# Patient Record
Sex: Female | Born: 1980 | Race: White | Hispanic: No | Smoking: Never smoker
Health system: Southern US, Community
[De-identification: ages and names within clinical notes are randomized; demographics above are authoritative.]

## PROBLEM LIST (undated history)

## (undated) ENCOUNTER — Inpatient Hospital Stay (HOSPITAL_COMMUNITY): Payer: Self-pay

## (undated) DIAGNOSIS — E039 Hypothyroidism, unspecified: Secondary | ICD-10-CM

## (undated) DIAGNOSIS — R87619 Unspecified abnormal cytological findings in specimens from cervix uteri: Secondary | ICD-10-CM

## (undated) DIAGNOSIS — R87629 Unspecified abnormal cytological findings in specimens from vagina: Secondary | ICD-10-CM

## (undated) DIAGNOSIS — L03311 Cellulitis of abdominal wall: Secondary | ICD-10-CM

## (undated) DIAGNOSIS — F419 Anxiety disorder, unspecified: Secondary | ICD-10-CM

## (undated) DIAGNOSIS — Z789 Other specified health status: Secondary | ICD-10-CM

## (undated) DIAGNOSIS — O26879 Cervical shortening, unspecified trimester: Secondary | ICD-10-CM

## (undated) HISTORY — DX: Anxiety disorder, unspecified: F41.9

## (undated) HISTORY — DX: Cervical shortening, unspecified trimester: O26.879

## (undated) HISTORY — DX: Cellulitis of abdominal wall: L03.311

## (undated) HISTORY — PX: TUBAL LIGATION: SHX77

## (undated) HISTORY — PX: APPENDECTOMY: SHX54

## (undated) HISTORY — DX: Unspecified abnormal cytological findings in specimens from vagina: R87.629

## (undated) HISTORY — DX: Unspecified abnormal cytological findings in specimens from cervix uteri: R87.619

---

## 2014-06-23 LAB — OB RESULTS CONSOLE HIV ANTIBODY (ROUTINE TESTING): HIV: NONREACTIVE

## 2014-06-23 LAB — OB RESULTS CONSOLE HEPATITIS B SURFACE ANTIGEN: Hepatitis B Surface Ag: NEGATIVE

## 2014-06-23 LAB — OB RESULTS CONSOLE HGB/HCT, BLOOD
HCT: 38 %
Hemoglobin: 12.9 g/dL

## 2014-06-23 LAB — OB RESULTS CONSOLE GC/CHLAMYDIA
Chlamydia: NEGATIVE
Gonorrhea: NEGATIVE

## 2014-06-23 LAB — OB RESULTS CONSOLE PLATELET COUNT: Platelets: 298 10*3/uL

## 2014-06-23 LAB — OB RESULTS CONSOLE VARICELLA ZOSTER ANTIBODY, IGG: Varicella: IMMUNE

## 2014-06-23 LAB — OB RESULTS CONSOLE RUBELLA ANTIBODY, IGM: Rubella: IMMUNE

## 2014-07-22 DIAGNOSIS — IMO0002 Reserved for concepts with insufficient information to code with codable children: Secondary | ICD-10-CM | POA: Insufficient documentation

## 2015-01-28 DIAGNOSIS — F419 Anxiety disorder, unspecified: Secondary | ICD-10-CM | POA: Insufficient documentation

## 2015-01-28 DIAGNOSIS — O09899 Supervision of other high risk pregnancies, unspecified trimester: Secondary | ICD-10-CM | POA: Insufficient documentation

## 2015-01-28 HISTORY — DX: Anxiety disorder, unspecified: F41.9

## 2015-01-28 LAB — OB RESULTS CONSOLE GC/CHLAMYDIA
Chlamydia: NEGATIVE
Gonorrhea: NEGATIVE

## 2015-01-28 LAB — OB RESULTS CONSOLE HIV ANTIBODY (ROUTINE TESTING): HIV: NONREACTIVE

## 2015-01-28 LAB — OB RESULTS CONSOLE RPR: RPR: NONREACTIVE

## 2015-01-28 LAB — OB RESULTS CONSOLE HGB/HCT, BLOOD
HCT: 40 %
Hemoglobin: 13.9 g/dL

## 2015-01-28 LAB — OB RESULTS CONSOLE RUBELLA ANTIBODY, IGM: Rubella: IMMUNE

## 2015-01-28 LAB — OB RESULTS CONSOLE PLATELET COUNT: Platelets: 293 10*3/uL

## 2015-03-25 ENCOUNTER — Encounter: Payer: Self-pay | Admitting: General Practice

## 2015-03-25 ENCOUNTER — Ambulatory Visit: Payer: Medicaid Other | Admitting: General Practice

## 2015-03-25 VITALS — BP 120/68 | HR 72 | Temp 98.6°F | Ht 63.0 in | Wt 139.1 lb

## 2015-03-25 DIAGNOSIS — Z7189 Other specified counseling: Secondary | ICD-10-CM

## 2015-03-25 NOTE — Progress Notes (Signed)
Patient here today to start 17p. Upon reviewing records and dating with patient. Patient is only 15.2 weeks. Discussed with patient that she is too early to start 17p but can start next week. Patient states she has an appt next week at her OB in [facility] and will get first injection there then return here the following week for an injection. Patient had no questions

## 2015-04-09 ENCOUNTER — Ambulatory Visit (INDEPENDENT_AMBULATORY_CARE_PROVIDER_SITE_OTHER): Payer: Medicaid Other | Admitting: General Practice

## 2015-04-09 VITALS — BP 128/70 | HR 81 | Temp 98.6°F | Ht 63.0 in | Wt 143.5 lb

## 2015-04-09 DIAGNOSIS — O09212 Supervision of pregnancy with history of pre-term labor, second trimester: Secondary | ICD-10-CM

## 2015-04-09 MED ORDER — HYDROXYPROGESTERONE CAPROATE 250 MG/ML IM OIL
250.0000 mg | TOPICAL_OIL | Freq: Once | INTRAMUSCULAR | Status: AC
  Administered 2015-04-09: 250 mg via INTRAMUSCULAR

## 2015-04-15 ENCOUNTER — Ambulatory Visit: Payer: Medicaid Other

## 2015-04-16 ENCOUNTER — Ambulatory Visit (INDEPENDENT_AMBULATORY_CARE_PROVIDER_SITE_OTHER): Payer: Medicaid Other | Admitting: *Deleted

## 2015-04-16 DIAGNOSIS — O09212 Supervision of pregnancy with history of pre-term labor, second trimester: Secondary | ICD-10-CM | POA: Diagnosis not present

## 2015-04-16 MED ORDER — HYDROXYPROGESTERONE CAPROATE 250 MG/ML IM OIL
250.0000 mg | TOPICAL_OIL | INTRAMUSCULAR | Status: AC
  Administered 2015-04-16 – 2015-06-18 (×9): 250 mg via INTRAMUSCULAR

## 2015-04-20 ENCOUNTER — Encounter (HOSPITAL_COMMUNITY): Payer: Self-pay | Admitting: *Deleted

## 2015-04-20 ENCOUNTER — Inpatient Hospital Stay (HOSPITAL_COMMUNITY)
Admission: AD | Admit: 2015-04-20 | Discharge: 2015-04-20 | Disposition: A | Discharge status: Home / Self Care | Payer: Medicaid Other | Source: Ambulatory Visit | Attending: Family Medicine | Admitting: Family Medicine

## 2015-04-20 DIAGNOSIS — R109 Unspecified abdominal pain: Secondary | ICD-10-CM | POA: Diagnosis present

## 2015-04-20 DIAGNOSIS — Z3A19 19 weeks gestation of pregnancy: Secondary | ICD-10-CM

## 2015-04-20 DIAGNOSIS — O9989 Other specified diseases and conditions complicating pregnancy, childbirth and the puerperium: Secondary | ICD-10-CM

## 2015-04-20 DIAGNOSIS — O3432 Maternal care for cervical incompetence, second trimester: Secondary | ICD-10-CM | POA: Insufficient documentation

## 2015-04-20 HISTORY — DX: Other specified health status: Z78.9

## 2015-04-20 LAB — URINALYSIS, ROUTINE W REFLEX MICROSCOPIC
Bilirubin Urine: NEGATIVE
Glucose, UA: NEGATIVE mg/dL
Hgb urine dipstick: NEGATIVE
Ketones, ur: NEGATIVE mg/dL
Leukocytes, UA: NEGATIVE
Nitrite: NEGATIVE
Protein, ur: NEGATIVE mg/dL
Specific Gravity, Urine: 1.02 (ref 1.005–1.030)
Urobilinogen, UA: 1 mg/dL (ref 0.0–1.0)
pH: 7 (ref 5.0–8.0)

## 2015-04-20 NOTE — MAU Provider Note (Signed)
  History  [id number] @ 19 wks in with abd cramping that started today. Pt had pretrm at 23 wks that did not survive past 1 month of age. Pt is now 19 wks with rescue stitch in place.  CSN: [id number]  Arrival date and time: 04/20/15 1857   First Provider Initiated Contact with Patient 04/20/15 1918      Chief Complaint  Patient presents with  . Abdominal Pain   HPI  OB History    Gravida Para Term Preterm AB TAB SAB Ectopic Multiple Living   Past Medical History  Diagnosis Date  . Medical history non-contributory     Past Surgical History  Procedure Laterality Date  . Cesarean section    . Appendectomy      No family history on file.  Social History  Substance Use Topics  . Smoking status: Never Smoker   . Smokeless tobacco: None  . Alcohol Use: No    Allergies: Not on File  Facility-administered medications prior to admission  Medication Dose Route Frequency Provider Last Rate Last Dose  . hydroxyprogesterone caproate (DELALUTIN) 250 mg/mL injection 250 mg  250 mg Intramuscular Weekly Adam Phenix, MD   250 mg at 04/16/15 1158   No prescriptions prior to admission    Review of Systems  Constitutional: Negative.   HENT: Negative.   Eyes: Negative.   Respiratory: Negative.   Cardiovascular: Negative.   Gastrointestinal: Positive for abdominal pain.  Genitourinary: Negative.   Musculoskeletal: Negative.   Skin: Negative.   Neurological: Negative.   Endo/Heme/Allergies: Negative.   Psychiatric/Behavioral: Negative.    Physical Exam   Blood pressure 136/77, pulse 72, temperature 98 F (36.7 C), temperature source Oral, resp. rate 18, height  (1.6 m), weight 146 lb 12.8 oz (66.588 kg), last menstrual period 11/26/2014.  Physical Exam  Constitutional: She is oriented to person, place, and time. She appears well-developed and well-nourished.  HENT:  Head: Normocephalic.  Eyes: Pupils are equal, round, and reactive to light.   Neck: Normal range of motion.  Cardiovascular: Normal rate, regular rhythm, normal heart sounds and intact distal pulses.   Respiratory: Effort normal and breath sounds normal.  GI: Soft. Bowel sounds are normal.  Genitourinary: Vagina normal and uterus normal.  Musculoskeletal: Normal range of motion.  Neurological: She is alert and oriented to person, place, and time. She has normal reflexes.  Skin: Skin is warm and dry.  Psychiatric: She has a normal mood and affect. Her behavior is normal. Judgment and thought content normal.    MAU Course  Procedures  MDM Cervical incompetence preg at 19 wks  Assessment and Plan  SVE cl/post/ no tension on stitch/ presenting part high. Will d/c home with no contractions,message sent to high risk clinic to contact pt for appt  Wyvonnia Dusky DARLENE 04/20/2015, 7:32 PM

## 2015-04-20 NOTE — MAU Note (Signed)
Pt presents complaining of lower abdominal pain that started at 3pm. States she had a 23w delivery in April. Had a cerclage placed in [place] at 16w. Denies vaginal bleeding or leaking.

## 2015-04-20 NOTE — Discharge Instructions (Signed)

## 2015-04-22 DIAGNOSIS — Z7185 Encounter for immunization safety counseling: Secondary | ICD-10-CM | POA: Insufficient documentation

## 2015-04-22 DIAGNOSIS — O99891 Other specified diseases and conditions complicating pregnancy: Secondary | ICD-10-CM | POA: Insufficient documentation

## 2015-04-22 DIAGNOSIS — Z2839 Other underimmunization status: Secondary | ICD-10-CM | POA: Insufficient documentation

## 2015-04-22 DIAGNOSIS — O09899 Supervision of other high risk pregnancies, unspecified trimester: Secondary | ICD-10-CM | POA: Insufficient documentation

## 2015-04-23 ENCOUNTER — Ambulatory Visit (INDEPENDENT_AMBULATORY_CARE_PROVIDER_SITE_OTHER): Payer: Medicaid Other

## 2015-04-23 VITALS — BP 112/74 | HR 79 | Wt 146.7 lb

## 2015-04-23 DIAGNOSIS — O09212 Supervision of pregnancy with history of pre-term labor, second trimester: Secondary | ICD-10-CM

## 2015-04-23 DIAGNOSIS — Z8751 Personal history of pre-term labor: Secondary | ICD-10-CM

## 2015-04-30 ENCOUNTER — Ambulatory Visit (INDEPENDENT_AMBULATORY_CARE_PROVIDER_SITE_OTHER): Payer: Medicaid Other | Admitting: *Deleted

## 2015-04-30 DIAGNOSIS — O09212 Supervision of pregnancy with history of pre-term labor, second trimester: Secondary | ICD-10-CM

## 2015-05-07 ENCOUNTER — Ambulatory Visit (INDEPENDENT_AMBULATORY_CARE_PROVIDER_SITE_OTHER): Payer: Medicaid Other

## 2015-05-07 ENCOUNTER — Encounter: Payer: Medicaid Other | Admitting: Obstetrics & Gynecology

## 2015-05-07 VITALS — BP 111/65 | HR 68 | Wt 149.3 lb

## 2015-05-07 DIAGNOSIS — O09212 Supervision of pregnancy with history of pre-term labor, second trimester: Secondary | ICD-10-CM | POA: Diagnosis present

## 2015-05-07 DIAGNOSIS — O09892 Supervision of other high risk pregnancies, second trimester: Secondary | ICD-10-CM

## 2015-05-14 ENCOUNTER — Ambulatory Visit (INDEPENDENT_AMBULATORY_CARE_PROVIDER_SITE_OTHER): Payer: Medicaid Other

## 2015-05-14 VITALS — BP 111/64 | HR 77

## 2015-05-14 DIAGNOSIS — O09892 Supervision of other high risk pregnancies, second trimester: Secondary | ICD-10-CM

## 2015-05-14 DIAGNOSIS — O09212 Supervision of pregnancy with history of pre-term labor, second trimester: Secondary | ICD-10-CM

## 2015-05-21 ENCOUNTER — Ambulatory Visit (INDEPENDENT_AMBULATORY_CARE_PROVIDER_SITE_OTHER): Payer: Medicaid Other | Admitting: *Deleted

## 2015-05-21 DIAGNOSIS — O09212 Supervision of pregnancy with history of pre-term labor, second trimester: Secondary | ICD-10-CM

## 2015-05-21 DIAGNOSIS — Z8751 Personal history of pre-term labor: Secondary | ICD-10-CM

## 2015-05-23 ENCOUNTER — Encounter: Payer: Self-pay | Admitting: *Deleted

## 2015-05-23 DIAGNOSIS — O0990 Supervision of high risk pregnancy, unspecified, unspecified trimester: Secondary | ICD-10-CM

## 2015-05-23 DIAGNOSIS — O09219 Supervision of pregnancy with history of pre-term labor, unspecified trimester: Secondary | ICD-10-CM | POA: Insufficient documentation

## 2015-05-23 DIAGNOSIS — O26879 Cervical shortening, unspecified trimester: Secondary | ICD-10-CM

## 2015-05-23 HISTORY — DX: Cervical shortening, unspecified trimester: O26.879

## 2015-05-28 ENCOUNTER — Ambulatory Visit (INDEPENDENT_AMBULATORY_CARE_PROVIDER_SITE_OTHER): Payer: Medicaid Other

## 2015-05-28 VITALS — BP 122/77 | HR 75 | Wt 153.5 lb

## 2015-05-28 DIAGNOSIS — O09212 Supervision of pregnancy with history of pre-term labor, second trimester: Secondary | ICD-10-CM | POA: Diagnosis not present

## 2015-05-28 NOTE — Progress Notes (Signed)
Contacted [facility] for refill of [employer].  Informed medication should arrive early next week.

## 2015-06-04 ENCOUNTER — Ambulatory Visit (INDEPENDENT_AMBULATORY_CARE_PROVIDER_SITE_OTHER): Payer: Medicaid Other

## 2015-06-04 VITALS — BP 120/68 | HR 91 | Temp 98.4°F | Wt 156.1 lb

## 2015-06-04 DIAGNOSIS — O09212 Supervision of pregnancy with history of pre-term labor, second trimester: Secondary | ICD-10-CM | POA: Diagnosis not present

## 2015-06-04 MED ORDER — HYDROXYPROGESTERONE CAPROATE 250 MG/ML IM OIL
250.0000 mg | TOPICAL_OIL | Freq: Once | INTRAMUSCULAR | Status: AC
  Administered 2015-06-04: 250 mg via INTRAMUSCULAR

## 2015-06-11 ENCOUNTER — Ambulatory Visit (INDEPENDENT_AMBULATORY_CARE_PROVIDER_SITE_OTHER): Payer: Medicaid Other | Admitting: *Deleted

## 2015-06-11 VITALS — BP 119/68 | HR 82 | Temp 98.7°F | Wt 156.0 lb

## 2015-06-11 DIAGNOSIS — O09213 Supervision of pregnancy with history of pre-term labor, third trimester: Secondary | ICD-10-CM

## 2015-06-11 DIAGNOSIS — O09219 Supervision of pregnancy with history of pre-term labor, unspecified trimester: Secondary | ICD-10-CM

## 2015-06-18 ENCOUNTER — Ambulatory Visit (INDEPENDENT_AMBULATORY_CARE_PROVIDER_SITE_OTHER): Payer: Medicaid Other

## 2015-06-18 VITALS — BP 124/68 | HR 97 | Wt 156.5 lb

## 2015-06-18 DIAGNOSIS — O26872 Cervical shortening, second trimester: Secondary | ICD-10-CM | POA: Diagnosis not present

## 2015-06-18 DIAGNOSIS — O26879 Cervical shortening, unspecified trimester: Secondary | ICD-10-CM

## 2015-06-20 ENCOUNTER — Inpatient Hospital Stay (HOSPITAL_COMMUNITY)
Admission: AD | Admit: 2015-06-20 | Discharge: 2015-06-20 | Disposition: A | Discharge status: Home / Self Care | Payer: Medicaid Other | Source: Ambulatory Visit | Attending: Obstetrics and Gynecology | Admitting: Obstetrics and Gynecology

## 2015-06-20 ENCOUNTER — Encounter (HOSPITAL_COMMUNITY): Payer: Self-pay | Admitting: *Deleted

## 2015-06-20 DIAGNOSIS — Z3A27 27 weeks gestation of pregnancy: Secondary | ICD-10-CM | POA: Insufficient documentation

## 2015-06-20 DIAGNOSIS — O4703 False labor before 37 completed weeks of gestation, third trimester: Secondary | ICD-10-CM | POA: Diagnosis not present

## 2015-06-20 DIAGNOSIS — O09212 Supervision of pregnancy with history of pre-term labor, second trimester: Secondary | ICD-10-CM | POA: Insufficient documentation

## 2015-06-20 DIAGNOSIS — O4700 False labor before 37 completed weeks of gestation, unspecified trimester: Secondary | ICD-10-CM | POA: Diagnosis not present

## 2015-06-20 DIAGNOSIS — O0990 Supervision of high risk pregnancy, unspecified, unspecified trimester: Secondary | ICD-10-CM

## 2015-06-20 DIAGNOSIS — O26879 Cervical shortening, unspecified trimester: Secondary | ICD-10-CM

## 2015-06-20 LAB — URINALYSIS, ROUTINE W REFLEX MICROSCOPIC
Bilirubin Urine: NEGATIVE
Glucose, UA: 100 mg/dL — AB
Hgb urine dipstick: NEGATIVE
Ketones, ur: NEGATIVE mg/dL
Leukocytes, UA: NEGATIVE
Nitrite: NEGATIVE
Protein, ur: NEGATIVE mg/dL
Specific Gravity, Urine: <1.005 — ABNORMAL LOW (ref 1.005–1.030)
pH: 5.5 (ref 5.0–8.0)

## 2015-06-20 LAB — WET PREP, GENITAL
Clue Cells Wet Prep HPF POC: NONE SEEN
Sperm: NONE SEEN
Trich, Wet Prep: NONE SEEN
Yeast Wet Prep HPF POC: NONE SEEN

## 2015-06-20 MED ORDER — BETAMETHASONE SOD PHOS & ACET 6 (3-3) MG/ML IJ SUSP
12.0000 mg | Freq: Once | INTRAMUSCULAR | Status: AC
  Administered 2015-06-20: 12 mg via INTRAMUSCULAR
  Filled 2015-06-20: qty 2

## 2015-06-20 MED ORDER — NIFEDIPINE 20 MG PO CAPS: 20.0000 mg | ORAL_CAPSULE | Freq: Three times a day (TID) | ORAL | Status: DC

## 2015-06-20 MED ORDER — NIFEDIPINE 10 MG PO CAPS
20.0000 mg | ORAL_CAPSULE | Freq: Once | ORAL | Status: AC
  Administered 2015-06-20: 20 mg via ORAL
  Filled 2015-06-20: qty 2

## 2015-06-20 MED ORDER — LACTATED RINGERS IV BOLUS (SEPSIS)
1000.0000 mL | Freq: Once | INTRAVENOUS | Status: AC
  Administered 2015-06-20: 1000 mL via INTRAVENOUS

## 2015-06-20 NOTE — MAU Provider Note (Signed)
MAU HISTORY AND PHYSICAL  Chief Complaint:  Contractions   Nicole Pollard is a 34 y.o.  [id number] with IUP at [redacted]w[redacted]d presenting for Contractions  [facility]ed [facility]. Per pt had rescue cerclage placed ~16 weeks for cervical insufficiency. Is taking vaginal prometrium and weekly 17-p.  Reports that beginning last night has experienced mild contractions every 30 minutes or so and leakage of thick mucous, no fluid. NO bleeding. No dysuria or hematuria. Nothing in vagina save for prometrium last 48 hours.    Past Medical History  Diagnosis Date  . Medical history non-contributory   . 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 Abnormal Pap smear of cervix     12/15/14 - ASCUS + HPV    Past Surgical History  Procedure Laterality Date  . Cesarean section    . Appendectomy      Family History  Problem Relation Age of Onset  . Alcohol abuse Mother   . Drug abuse Father   . Hypertension Father   . Anxiety disorder Maternal Aunt   . Cancer Maternal Grandmother     Social History  Substance Use Topics  . Smoking status: Never Smoker   . Smokeless tobacco: Never Used  . Alcohol Use: No    No Known Allergies  Facility-administered medications prior to admission  Medication Dose Route Frequency Provider Last Rate Last Dose  . hydroxyprogesterone caproate (DELALUTIN) 250 mg/mL injection 250 mg  250 mg Intramuscular Weekly Nicole Phenix, MD   250 mg at 06/18/15 4540   Prescriptions prior to admission  Medication Sig Dispense Refill Last Dose  . acetaminophen (TYLENOL) 500 MG tablet Take 500 mg by mouth every 6 (six) hours as needed for mild pain.   Past Week at Unknown time  . Prenatal Vit-Fe Fumarate-FA (PRENATAL MULTIVITAMIN) TABS tablet Take 1 tablet by mouth daily at 12 noon.   06/20/2015 at Unknown time  . progesterone 200 MG SUPP Place 200 mg vaginally at bedtime.   Past Week at Unknown time  . sertraline (ZOLOFT) 50 MG tablet Take 50 mg by mouth daily.    06/20/2015 at Unknown time    Review of Systems - Negative except for what is mentioned in HPI.  Physical Exam  Blood pressure 108/63, pulse 101, temperature 99.2 F (37.3 C), temperature source Oral, resp. rate 18, last menstrual period 11/26/2014. GENERAL: Well-developed, well-nourished female in no acute distress.  LUNGS: Clear to auscultation bilaterally.  HEART: Regular rate and rhythm. ABDOMEN: Soft, nontender, nondistended, gravid.  EXTREMITIES: Nontender, no edema, 2+ distal pulses. GU: no pooling, cerclage in placed, cervix closed on exam without appreciable tension on the cerclage 135/mod/+a/one variable Contractions: irritability   Labs: Results for orders placed or performed during the hospital encounter of 06/20/15 (from the past 24 hour(s))  Wet prep, genital   Collection Time: 06/20/15 12:45 PM  Result Value Ref Range   Yeast Wet Prep HPF POC NONE SEEN NONE SEEN   Trich, Wet Prep NONE SEEN NONE SEEN   Clue Cells Wet Prep HPF POC NONE SEEN NONE SEEN   WBC, Wet Prep HPF POC MODERATE (A) NONE SEEN   Sperm NONE SEEN   Urinalysis, Routine w reflex microscopic (not at [facility])   Collection Time: 06/20/15  1:46 PM  Result Value Ref Range   Color, Urine YELLOW YELLOW   APPearance CLEAR CLEAR   Specific Gravity, Urine <1.005 (L) 1.005 - 1.030   pH 5.5 5.0 - 8.0  Glucose, UA 100 (A) NEGATIVE mg/dL   Hgb urine dipstick NEGATIVE NEGATIVE   Bilirubin Urine NEGATIVE NEGATIVE   Ketones, ur NEGATIVE NEGATIVE mg/dL   Protein, ur NEGATIVE NEGATIVE mg/dL   Nitrite NEGATIVE NEGATIVE   Leukocytes, UA NEGATIVE NEGATIVE    Imaging Studies:  No results found.  Assessment: Nicole Pollard is  34 y.o. [id number] at 5067w5d presents with threatened preterm labor. Rescue cerclage in place. Contractions are mild and infrequent, and on exam cervix closed without appreciable tension on the cerclage. No pooling and fern negative so do not think PPROM. Symptoms improved with IV hydration and  procardia. No signs vaginitis on wet prep; urinalysis not suggestive of infection, and gc/chlamydia ordered and also pending. Gave first dose betamethasone at 14:00; has not had corticosteroids yet this pregnancy.  Plan: - PTL/PPROM/abruption return precautions - return to our MAU tomorrow at 14:00 for second dose betamethasone - f/u gc/chlamydia - procardia prn - hydration - [facility] f/u this coming week as scheduled  Nicole Pollard 11/19/20162:49 PM

## 2015-06-20 NOTE — MAU Note (Signed)
Pt started feeling contractions last night and a stabbing feeling in her vagina.  Lost her mucus plug when she went to the bathroom this morning. Has a cerglage since 16 weeks.  Previous preterm delivery with first baby (33weeks) and last baby (23 weeks).

## 2015-06-23 LAB — CULTURE, BETA STREP (GROUP B ONLY)

## 2015-06-24 ENCOUNTER — Ambulatory Visit: Payer: Medicaid Other

## 2015-06-24 LAB — GC/CHLAMYDIA PROBE AMP (~~LOC~~) NOT AT ARMC
Chlamydia: NEGATIVE
Neisseria Gonorrhea: NEGATIVE

## 2015-07-02 ENCOUNTER — Ambulatory Visit: Payer: Medicaid Other

## 2015-07-09 ENCOUNTER — Ambulatory Visit: Payer: Medicaid Other

## 2015-07-16 ENCOUNTER — Telehealth: Payer: Self-pay | Admitting: Family Medicine

## 2015-07-16 ENCOUNTER — Encounter: Payer: Self-pay | Admitting: Obstetrics & Gynecology

## 2015-07-16 ENCOUNTER — Ambulatory Visit: Payer: Medicaid Other

## 2015-07-16 NOTE — Telephone Encounter (Signed)
Called patient, no voicemail setup. Will mail dnka letter regarding missed appointment.

## 2016-01-28 ENCOUNTER — Encounter (HOSPITAL_COMMUNITY): Payer: Self-pay | Admitting: *Deleted

## 2016-04-21 ENCOUNTER — Emergency Department
Admission: EM | Admit: 2016-04-21 | Discharge: 2016-04-21 | Disposition: A | Discharge status: Home / Self Care | Payer: Medicaid Other | Attending: Emergency Medicine | Admitting: Emergency Medicine

## 2016-04-21 DIAGNOSIS — M545 Low back pain, unspecified: Secondary | ICD-10-CM

## 2016-04-21 DIAGNOSIS — Z79899 Other long term (current) drug therapy: Secondary | ICD-10-CM | POA: Insufficient documentation

## 2016-04-21 LAB — BASIC METABOLIC PANEL
Anion gap: 7 (ref 5–15)
BUN: 14 mg/dL (ref 6–20)
CO2: 27 mmol/L (ref 22–32)
Calcium: 9.2 mg/dL (ref 8.9–10.3)
Chloride: 105 mmol/L (ref 101–111)
Creatinine, Ser: 0.62 mg/dL (ref 0.44–1.00)
GFR calc Af Amer: >60 mL/min (ref >60)
GFR calc non Af Amer: >60 mL/min (ref >60)
Glucose, Bld: 97 mg/dL (ref 65–99)
Potassium: 3.4 mmol/L — ABNORMAL LOW (ref 3.5–5.1)
Sodium: 139 mmol/L (ref 135–145)

## 2016-04-21 LAB — URINALYSIS COMPLETE WITH MICROSCOPIC (ARMC ONLY)
Bacteria, UA: NONE SEEN
Bilirubin Urine: NEGATIVE
Glucose, UA: NEGATIVE mg/dL
Hgb urine dipstick: NEGATIVE
Ketones, ur: NEGATIVE mg/dL
Leukocytes, UA: NEGATIVE
Nitrite: NEGATIVE
Protein, ur: NEGATIVE mg/dL
Specific Gravity, Urine: 1.006 (ref 1.005–1.030)
Squamous Epithelial / LPF: NONE SEEN
pH: 6 (ref 5.0–8.0)

## 2016-04-21 LAB — CBC
HCT: 39.9 % (ref 35.0–47.0)
Hemoglobin: 13.4 g/dL (ref 12.0–16.0)
MCH: 30.2 pg (ref 26.0–34.0)
MCHC: 33.6 g/dL (ref 32.0–36.0)
MCV: 90 fL (ref 80.0–100.0)
Platelets: 335 10*3/uL (ref 150–440)
RBC: 4.44 MIL/uL (ref 3.80–5.20)
RDW: 12.7 % (ref 11.5–14.5)
WBC: 8.4 10*3/uL (ref 3.6–11.0)

## 2016-04-21 LAB — POCT PREGNANCY, URINE: Preg Test, Ur: NEGATIVE

## 2016-04-21 NOTE — ED Provider Notes (Signed)
[facility] Emergency Department Provider Note  ____________________________________________   I have reviewed the triage vital signs and the nursing notes.   HISTORY  Chief Complaint Flank Pain    HPI Nicole Pollard is a 35 y.o. female who presents today complaining of occasional lower back pain. Patient states she had a urinary tract infection about one month ago took 3 days of antibiotics after her positive culture and was then asymptomatic. She had no dysuria or urinary frequency. However, since that time she has occasional back pain. Patient states this is happened to her before. She has had negative renal ultrasounds in the past. She does have a history recurrent urinary tract infections. She is not having any urinary symptoms at this time. She denies any fever or chills. She is not having any pain at this moment. Sometimes the pain is when she twists or moves the wrong way. No numbness no weakness no incontinence of bowel or bladder. Denies any other complaint.      Past Medical History:  Diagnosis Date  . Abnormal Pap smear of cervix    12/15/14 - ASCUS + HPV  . Medical history non-contributory   . Vaginal Pap smear, abnormal    06/23/14 ASCUS + HPV    Patient Active Problem List   Diagnosis Date Noted  . Supervision of high-risk pregnancy 05/23/2015  . Current pregnancy with history of preterm labor 05/23/2015  . Short cervix affecting pregnancy 05/23/2015    Past Surgical History:  Procedure Laterality Date  . APPENDECTOMY    . CESAREAN SECTION      Prior to Admission medications   Medication Sig Start Date End Date Taking? Authorizing Provider  acetaminophen (TYLENOL) 500 MG tablet Take 500 mg by mouth every 6 (six) hours as needed for mild pain.    Historical Provider, MD  NIFEdipine (PROCARDIA) 20 MG capsule Take 1 capsule (20 mg total) by mouth 3 (three) times daily. 06/20/15   Kathrynn RunningNoah Bedford Wouk, MD  Prenatal Vit-Fe Fumarate-FA  (PRENATAL MULTIVITAMIN) TABS tablet Take 1 tablet by mouth daily at 12 noon.    Historical Provider, MD  progesterone 200 MG SUPP Place 200 mg vaginally at bedtime.    Historical Provider, MD  sertraline (ZOLOFT) 50 MG tablet Take 50 mg by mouth daily.    Historical Provider, MD    Allergies Review of patient's allergies indicates no known allergies.  Family History  Problem Relation Age of Onset  . Alcohol abuse Mother   . Drug abuse Father   . Hypertension Father   . Anxiety disorder Maternal Aunt   . Cancer Maternal Grandmother     Social History Social History  Substance Use Topics  . Smoking status: Never Smoker  . Smokeless tobacco: Never Used  . Alcohol use No    Review of Systems Constitutional: No fever/chills Eyes: No visual changes. ENT: No sore throat. No stiff neck no neck pain Cardiovascular: Denies chest pain. Respiratory: Denies shortness of breath. Gastrointestinal:   no vomiting.  No diarrhea.  No constipation. Genitourinary: Negative for dysuria. Musculoskeletal: Negative lower extremity swelling Skin: Negative for rash. Neurological: Negative for severe headaches, focal weakness or numbness. 10-point ROS otherwise negative.  ____________________________________________   PHYSICAL EXAM:  VITAL SIGNS: ED Triage Vitals [04/21/16 1916]  Enc Vitals Group     BP 121/78     Pulse Rate 89     Resp 16     Temp 97.8 F (36.6 C)     Temp Source Oral  SpO2 100 %     Weight 157 lb (71.2 kg)     Height 5\' 3"  (1.6 m)     Head Circumference      Peak Flow      Pain Score 7     Pain Loc      Pain Edu?      Excl. in GC?     Constitutional: Alert and oriented. Well appearing and in no acute distress. Eyes: Conjunctivae are normal. PERRL. EOMI. Head: Atraumatic. Nose: No congestion/rhinnorhea. Mouth/Throat: Mucous membranes are moist.  Oropharynx non-erythematous. Neck: No stridor.   Nontender with no meningismus Cardiovascular: Normal rate,  regular rhythm. Grossly normal heart sounds.  Good peripheral circulation. Respiratory: Normal respiratory effort.  No retractions. Lungs CTAB. Abdominal: Soft and nontender. No distention. No guarding no rebound Back:  There is no focal tenderness or step off.  there is no midline tenderness there are no lesions noted. there is no CVA tenderness Musculoskeletal: No lower extremity tenderness, no upper extremity tenderness. No joint effusions, no DVT signs strong distal pulses no edema Neurologic:  Normal speech and language. No gross focal neurologic deficits are appreciated.  Skin:  Skin is warm, dry and intact. No rash noted. Psychiatric: Mood and affect are normal. Speech and behavior are normal.  ____________________________________________   LABS (all labs ordered are listed, but only abnormal results are displayed)  Labs Reviewed  URINALYSIS COMPLETEWITH MICROSCOPIC ([facility] ONLY) - Abnormal; Notable for the following:       Result Value   Color, Urine STRAW (*)    APPearance CLEAR (*)    All other components within normal limits  BASIC METABOLIC PANEL - Abnormal; Notable for the following:    Potassium 3.4 (*)    All other components within normal limits  URINE CULTURE  CBC  POC URINE PREG, ED  POCT PREGNANCY, URINE   ____________________________________________  EKG  I personally interpreted any EKGs ordered by me or triage  ____________________________________________  RADIOLOGY  I reviewed any imaging ordered by me or triage that were performed during my shift and, if possible, patient and/or family made aware of any abnormal findings. ____________________________________________   PROCEDURES  Procedure(s) performed: None  Procedures  Critical Care performed: None  ____________________________________________   INITIAL IMPRESSION / ASSESSMENT AND PLAN / ED COURSE  Pertinent labs & imaging results that were available during my care of the patient were  reviewed by me and considered in my medical decision making (see chart for details).  Patient very well-appearing with no evidence of reproducible pain no evidence of urinary tract infection, blood work is reassuring, exam is reassuring, no evidence of cauda equina syndrome. She is not pregnant. I did offer to keep her here for ultrasound of her kidney center that there is no Hydro after multiple pregnancies, but she declines. She states she has had that done and has appointment with her OB in the next few days. She's not having any pain at this moment she does not wish any pain medication, I have sent a urine culture and if positive we'll treat her that case. Patient very comfortable with this plan and will return if she feels worse.  Clinical Course   ____________________________________________   FINAL CLINICAL IMPRESSION(S) / ED DIAGNOSES  Final diagnoses:  None      This chart was dictated using voice recognition software.  Despite best efforts to proofread,  errors can occur which can change meaning.      Jeanmarie Plant, MD 04/21/16  2157  

## 2016-04-21 NOTE — ED Triage Notes (Signed)
Pt presents to ED with c/o bilateral flank pain for greater than 1 month. Pt reports going to the clinic about 3 weeks ago and r/x'd Macrobid and took it without relief. Pt denies any N/V/D or fever, reports denies any pain or burning with urination. Pt reports pain increases when "I turn a certain way". Pt reports previous h/x of kidney infections "several times".

## 2016-04-23 LAB — URINE CULTURE

## 2016-06-28 HISTORY — PX: ABDOMINOPLASTY: SUR9

## 2016-07-17 DIAGNOSIS — Z79899 Other long term (current) drug therapy: Secondary | ICD-10-CM | POA: Insufficient documentation

## 2016-07-17 DIAGNOSIS — L231 Allergic contact dermatitis due to adhesives: Secondary | ICD-10-CM | POA: Insufficient documentation

## 2016-07-17 LAB — CBC
HCT: 37.1 % (ref 35.0–47.0)
Hemoglobin: 12.6 g/dL (ref 12.0–16.0)
MCH: 29.3 pg (ref 26.0–34.0)
MCHC: 33.9 g/dL (ref 32.0–36.0)
MCV: 86.4 fL (ref 80.0–100.0)
Platelets: 411 10*3/uL (ref 150–440)
RBC: 4.3 MIL/uL (ref 3.80–5.20)
RDW: 14.4 % (ref 11.5–14.5)
WBC: 7.4 10*3/uL (ref 3.6–11.0)

## 2016-07-17 LAB — BASIC METABOLIC PANEL
Anion gap: 6 (ref 5–15)
BUN: 10 mg/dL (ref 6–20)
CO2: 26 mmol/L (ref 22–32)
Calcium: 9 mg/dL (ref 8.9–10.3)
Chloride: 105 mmol/L (ref 101–111)
Creatinine, Ser: 0.72 mg/dL (ref 0.44–1.00)
GFR calc Af Amer: >60 mL/min (ref >60)
GFR calc non Af Amer: >60 mL/min (ref >60)
Glucose, Bld: 101 mg/dL — ABNORMAL HIGH (ref 65–99)
Potassium: 3.7 mmol/L (ref 3.5–5.1)
Sodium: 137 mmol/L (ref 135–145)

## 2016-07-17 LAB — LACTIC ACID, PLASMA: Lactic Acid, Venous: 1.3 mmol/L (ref 0.5–1.9)

## 2016-07-17 NOTE — ED Triage Notes (Signed)
Pt [facility] [place] by Dr. Garnette CzechSampson.  Pt states that on 12/9 she noticed that the incision had some drainage come from it and was more red.  She was prescribed cephalexin by Dr in [place] and had finished those antibiotics.  Pt then went to a clinic in [place] and was given Bactrim.  Pt states she has one more dose of bactrim, with no relief.  Pt was told by friend to crush an antibiotic pill and put it in the incision area to help with infection.  Area cleaned off and some dried purulent drainage noted to middle of incision area.  Entire incision area inflamed, red and warm to touch.  Pt denies fevers at home.  Ambulatory to triage with NAD noted at this time.

## 2016-07-18 ENCOUNTER — Emergency Department
Admission: EM | Admit: 2016-07-18 | Discharge: 2016-07-18 | Disposition: A | Discharge status: Home / Self Care | Payer: Medicaid Other | Attending: Emergency Medicine | Admitting: Emergency Medicine

## 2016-07-18 DIAGNOSIS — L231 Allergic contact dermatitis due to adhesives: Secondary | ICD-10-CM

## 2016-07-18 NOTE — ED Provider Notes (Signed)
[facility] Emergency Department Provider Note  ____________________________________________   First MD Initiated Contact with Patient 07/18/16 [phone]     (approximate)  I have reviewed the triage vital signs and the nursing notes.   HISTORY  Chief Complaint Post-op Problem   HPI Jerl MinaBrenna Helm is a 35 y.o. female who had a [place]os[place] in [place] in late November of this year who is presenting with irritation over the incision site. She says that she has been placed on Keflex and then Bactrim and is almost finished her course of Bactrim. She says that after using adhesive tape that she bought [facility] to cover the scar she began having redness with itching. She says there is a small area in the middle of the scar where she is seen a small amount of pus although it is dry at this time. Denies any fever. Denies any worsening of pain over the incision or to her abdomen. Denies any nausea vomiting or diarrhea.   Past Medical History:  Diagnosis Date  . Abnormal Pap smear of cervix    12/15/14 - ASCUS + HPV  . Medical history non-contributory   . Vaginal Pap smear, abnormal    06/23/14 ASCUS + HPV    Patient Active Problem List   Diagnosis Date Noted  . Supervision of high-risk pregnancy 05/23/2015  . Current pregnancy with history of preterm labor 05/23/2015  . Short cervix affecting pregnancy 05/23/2015    Past Surgical History:  Procedure Laterality Date  . ABDOMINOPLASTY  06/28/2016  . APPENDECTOMY    . CESAREAN SECTION      Prior to Admission medications   Medication Sig Start Date End Date Taking? Authorizing Provider  acetaminophen (TYLENOL) 500 MG tablet Take 500 mg by mouth every 6 (six) hours as needed for mild pain.    Historical Provider, MD  NIFEdipine (PROCARDIA) 20 MG capsule Take 1 capsule (20 mg total) by mouth 3 (three) times daily. 06/20/15   Kathrynn RunningNoah Bedford Wouk, MD  Prenatal Vit-Fe Fumarate-FA (PRENATAL MULTIVITAMIN) TABS  tablet Take 1 tablet by mouth daily at 12 noon.    Historical Provider, MD  progesterone 200 MG SUPP Place 200 mg vaginally at bedtime.    Historical Provider, MD  sertraline (ZOLOFT) 50 MG tablet Take 50 mg by mouth daily.    Historical Provider, MD    Allergies Patient has no known allergies.  Family History  Problem Relation Age of Onset  . Alcohol abuse Mother   . Drug abuse Father   . Hypertension Father   . Anxiety disorder Maternal Aunt   . Cancer Maternal Grandmother     Social History Social History  Substance Use Topics  . Smoking status: Never Smoker  . Smokeless tobacco: Never Used  . Alcohol use No    Review of Systems Constitutional: No fever/chills Eyes: No visual changes. ENT: No sore throat. Cardiovascular: Denies chest pain. Respiratory: Denies shortness of breath. Gastrointestinal:  No nausea, no vomiting.  No diarrhea.  No constipation. Genitourinary: Negative for dysuria. Musculoskeletal: Negative for back pain. Skin: As above Neurological: Negative for headaches, focal weakness or numbness.  10-point ROS otherwise negative.  ____________________________________________   PHYSICAL EXAM:  VITAL SIGNS: ED Triage Vitals  Enc Vitals Group     BP 07/17/16 2103 123/75     Pulse Rate 07/17/16 2103 90     Resp 07/17/16 2103 18     Temp 07/17/16 2103 98.4 F (36.9 C)     Temp Source 07/17/16  2103 Oral     SpO2 07/17/16 2103 100 %     Weight 07/17/16 2103 150 lb (68 kg)     Height 07/17/16 2103 5\' 3"  (1.6 m)     Head Circumference --      Peak Flow --      Pain Score 07/17/16 2104 4     Pain Loc --      Pain Edu? --      Excl. in GC? --     Constitutional: Alert and oriented. Well appearing and in no acute distress. Eyes: Conjunctivae are normal. PERRL. EOMI. Head: Atraumatic. Nose: No congestion/rhinnorhea. Mouth/Throat: Mucous membranes are moist. Neck: No stridor.   Cardiovascular: Normal rate, regular rhythm. Grossly normal heart  sounds.   Respiratory: Normal respiratory effort.  No retractions. Lungs CTAB. Gastrointestinal: Soft and nontender. No distention. No CVA tenderness. Musculoskeletal: No lower extremity tenderness nor edema.  No joint effusions. Neurologic:  Normal speech and language. No gross focal neurologic deficits are appreciated. No gait instability. Skin:  Skin is warm, dry and intact. Mild erythema in a rectangular distribution directly overlying the patient's liposuction scar which extends the length of the lower abdomen. There is no dehiscence. No pus. No fluctuance. Also a small area of erythema over the umbilicus where she says she also had placed tape. There is no induration.  Psychiatric: Mood and affect are normal. Speech and behavior are normal.  ____________________________________________   LABS (all labs ordered are listed, but only abnormal results are displayed)  Labs Reviewed  BASIC METABOLIC PANEL - Abnormal; Notable for the following:       Result Value   Glucose, Bld 101 (*)    All other components within normal limits  CBC  LACTIC ACID, PLASMA   ____________________________________________  EKG   ____________________________________________  RADIOLOGY   ____________________________________________   PROCEDURES  Procedure(s) performed:   Procedures  Critical Care performed:   ____________________________________________   INITIAL IMPRESSION / ASSESSMENT AND PLAN / ED COURSE  Pertinent labs & imaging results that were available during my care of the patient were reviewed by me and considered in my medical decision making (see chart for details).   Clinical Course   ----------------------------------------- 1:06 AM on 07/18/2016 -----------------------------------------  Likely contact dermatitis from the tape the patient use. Will be discharged to home. Discussed finishing antibiotics and also using Neosporin cream and Benadryl cream as needed. She is  understanding of the plan and willing to comply.   ____________________________________________   FINAL CLINICAL IMPRESSION(S) / ED DIAGNOSES  Contact dermatitis.    NEW MEDICATIONS STARTED DURING THIS VISIT:  New Prescriptions   No medications on file     Note:  This document was prepared using Dragon voice recognition software and may include unintentional dictation errors.    Myrna Blazeravid Matthew Schaevitz, MD 07/18/16 [phone]

## 2016-07-18 NOTE — ED Notes (Signed)
Pt ambulatory with steady gait to registration area; waiting patiently for treatment room; pt says abd pain is worse if she presses on it; at rest rates 4/10; understands waiting for treatment room and should not eat or drink anything until seen by provider

## 2016-07-18 NOTE — ED Notes (Signed)
Pt had a "tummy tuck," done in [place] on 06/28/2016. Pt went to a clinic on 12/11 and received further antibiotics for an incisional infection. Pt's incision is red and pt has had some drainage from the incision site in the middle, that is yellow. Pt denying any fevers. Pt stating that she has some abdominal pain near her umbilicus. Pt stating that she also did have liposuction done. Pt is just concerned that her infection might not be cleared up, and she is almost out of her antibiotics.

## 2016-08-31 ENCOUNTER — Emergency Department (HOSPITAL_COMMUNITY)
Admission: EM | Admit: 2016-08-31 | Discharge: 2016-09-01 | Disposition: A | Discharge status: Home / Self Care | Payer: Self-pay | Attending: Emergency Medicine | Admitting: Emergency Medicine

## 2016-08-31 ENCOUNTER — Encounter (HOSPITAL_COMMUNITY): Payer: Self-pay

## 2016-08-31 DIAGNOSIS — M6208 Separation of muscle (nontraumatic), other site: Secondary | ICD-10-CM

## 2016-08-31 DIAGNOSIS — Z9889 Other specified postprocedural states: Secondary | ICD-10-CM

## 2016-08-31 DIAGNOSIS — T8149XA Infection following a procedure, other surgical site, initial encounter: Secondary | ICD-10-CM

## 2016-08-31 DIAGNOSIS — L03311 Cellulitis of abdominal wall: Secondary | ICD-10-CM | POA: Insufficient documentation

## 2016-08-31 DIAGNOSIS — T8141XA Infection following a procedure, superficial incisional surgical site, initial encounter: Secondary | ICD-10-CM

## 2016-08-31 NOTE — ED Triage Notes (Signed)
Patient had tummy tuck in November has been receiving  Antibiotic for infection at surgical incision since then. Small amount of drainage present on assessment. Pt state with pressure it oozes.

## 2016-09-01 ENCOUNTER — Emergency Department (HOSPITAL_COMMUNITY): Payer: Self-pay

## 2016-09-01 ENCOUNTER — Encounter (HOSPITAL_COMMUNITY): Payer: Self-pay

## 2016-09-01 DIAGNOSIS — L03311 Cellulitis of abdominal wall: Secondary | ICD-10-CM

## 2016-09-01 DIAGNOSIS — M6208 Separation of muscle (nontraumatic), other site: Secondary | ICD-10-CM

## 2016-09-01 DIAGNOSIS — Z9889 Other specified postprocedural states: Secondary | ICD-10-CM

## 2016-09-01 DIAGNOSIS — T8149XA Infection following a procedure, other surgical site, initial encounter: Secondary | ICD-10-CM

## 2016-09-01 DIAGNOSIS — T8141XA Infection following a procedure, superficial incisional surgical site, initial encounter: Secondary | ICD-10-CM

## 2016-09-01 HISTORY — DX: Cellulitis of abdominal wall: L03.311

## 2016-09-01 LAB — URINALYSIS, ROUTINE W REFLEX MICROSCOPIC
Bilirubin Urine: NEGATIVE
Glucose, UA: NEGATIVE mg/dL
Ketones, ur: NEGATIVE mg/dL
Leukocytes, UA: NEGATIVE
Nitrite: NEGATIVE
Protein, ur: NEGATIVE mg/dL
Specific Gravity, Urine: 1.013 (ref 1.005–1.030)
pH: 6 (ref 5.0–8.0)

## 2016-09-01 LAB — I-STAT BETA HCG BLOOD, ED (MC, WL, AP ONLY): I-stat hCG, quantitative: <5 m[IU]/mL (ref <5)

## 2016-09-01 LAB — CBC WITH DIFFERENTIAL/PLATELET
Basophils Absolute: 0.1 10*3/uL (ref 0.0–0.1)
Basophils Relative: 1 %
Eosinophils Absolute: 0.1 10*3/uL (ref 0.0–0.7)
Eosinophils Relative: 1 %
HCT: 35.3 % — ABNORMAL LOW (ref 36.0–46.0)
Hemoglobin: 11.5 g/dL — ABNORMAL LOW (ref 12.0–15.0)
Lymphocytes Relative: 24 %
Lymphs Abs: 2.6 10*3/uL (ref 0.7–4.0)
MCH: 27.8 pg (ref 26.0–34.0)
MCHC: 32.6 g/dL (ref 30.0–36.0)
MCV: 85.5 fL (ref 78.0–100.0)
Monocytes Absolute: 1.4 10*3/uL — ABNORMAL HIGH (ref 0.1–1.0)
Monocytes Relative: 13 %
Neutro Abs: 6.8 10*3/uL (ref 1.7–7.7)
Neutrophils Relative %: 61 %
Platelets: 394 10*3/uL (ref 150–400)
RBC: 4.13 MIL/uL (ref 3.87–5.11)
RDW: 13.9 % (ref 11.5–15.5)
WBC: 11 10*3/uL — ABNORMAL HIGH (ref 4.0–10.5)

## 2016-09-01 LAB — COMPREHENSIVE METABOLIC PANEL
ALT: 24 U/L (ref 14–54)
AST: 21 U/L (ref 15–41)
Albumin: 4.7 g/dL (ref 3.5–5.0)
Alkaline Phosphatase: 73 U/L (ref 38–126)
Anion gap: 11 (ref 5–15)
BUN: 9 mg/dL (ref 6–20)
CO2: 24 mmol/L (ref 22–32)
Calcium: 9.2 mg/dL (ref 8.9–10.3)
Chloride: 103 mmol/L (ref 101–111)
Creatinine, Ser: 0.48 mg/dL (ref 0.44–1.00)
GFR calc Af Amer: >60 mL/min (ref >60)
GFR calc non Af Amer: >60 mL/min (ref >60)
Glucose, Bld: 96 mg/dL (ref 65–99)
Potassium: 3.2 mmol/L — ABNORMAL LOW (ref 3.5–5.1)
Sodium: 138 mmol/L (ref 135–145)
Total Bilirubin: 0.2 mg/dL — ABNORMAL LOW (ref 0.3–1.2)
Total Protein: 8.1 g/dL (ref 6.5–8.1)

## 2016-09-01 MED ORDER — IOPAMIDOL (ISOVUE-300) INJECTION 61%
100.0000 mL | Freq: Once | INTRAVENOUS | Status: AC | PRN
  Administered 2016-09-01: 100 mL via INTRAVENOUS

## 2016-09-01 MED ORDER — HYDROCODONE-ACETAMINOPHEN 5-325 MG PO TABS: 1.0000 | ORAL_TABLET | Freq: Four times a day (QID) | ORAL | 0 refills | Status: DC | PRN

## 2016-09-01 MED ORDER — IOPAMIDOL (ISOVUE-300) INJECTION 61%
INTRAVENOUS | Status: AC
  Filled 2016-09-01: qty 100

## 2016-09-01 MED ORDER — NAPROXEN 500 MG PO TABS: 500.0000 mg | ORAL_TABLET | Freq: Two times a day (BID) | ORAL | 1 refills | Status: DC | PRN

## 2016-09-01 MED ORDER — LIDOCAINE HCL 2 % IJ SOLN
INTRAMUSCULAR | Status: AC
  Administered 2016-09-01: 04:00:00
  Filled 2016-09-01: qty 20

## 2016-09-01 MED ORDER — CLINDAMYCIN HCL 150 MG PO CAPS: 300.0000 mg | ORAL_CAPSULE | Freq: Three times a day (TID) | ORAL | 0 refills | Status: DC

## 2016-09-01 MED ORDER — CEFAZOLIN SODIUM-DEXTROSE 2-4 GM/100ML-% IV SOLN
2.0000 g | Freq: Three times a day (TID) | INTRAVENOUS | Status: DC
  Administered 2016-09-01: 2 g via INTRAVENOUS
  Filled 2016-09-01: qty 100

## 2016-09-01 MED ORDER — IBUPROFEN 800 MG PO TABS
800.0000 mg | ORAL_TABLET | Freq: Once | ORAL | Status: AC
  Administered 2016-09-01: 800 mg via ORAL
  Filled 2016-09-01: qty 1

## 2016-09-01 MED ORDER — DOXYCYCLINE HYCLATE 100 MG PO TABS: 100.0000 mg | ORAL_TABLET | Freq: Two times a day (BID) | ORAL | 2 refills | Status: DC

## 2016-09-01 MED ORDER — SODIUM CHLORIDE 0.9 % IJ SOLN
INTRAMUSCULAR | Status: AC
  Filled 2016-09-01: qty 50

## 2016-09-01 MED ORDER — VANCOMYCIN HCL IN DEXTROSE 1-5 GM/200ML-% IV SOLN
1000.0000 mg | Freq: Once | INTRAVENOUS | Status: AC
  Administered 2016-09-01: 1000 mg via INTRAVENOUS
  Filled 2016-09-01: qty 200

## 2016-09-01 NOTE — ED Provider Notes (Signed)
[facility]-EMERGENCY DEPT Provider Note   CSN: [id number] Arrival date & time: 08/31/16  2145  By signing my name below, I, Linna Darner, attest that this documentation has been prepared under the direction and in the presence of Arthor Captain, PA-C. Electronically Signed: Linna Darner, Scribe. 09/01/2016. 12:51 AM.  History   Chief Complaint Chief Complaint  Patient presents with  . Infection in Surgical Wound    The history is provided by the patient. No language interpreter was used.     HPI Comments: Nicole Pollard is a 36 y.o. female who presents to the Emergency Department complaining of persistent discharge from a surgical incision site since an abdominoplasty in November 2017. Pt states the fluid is alternately white and clear. She states she area is painful and firm as well. Pt's surgery was performed in [place], [place] and she cannot recall the surgeon's name. She is from [facility]. Pt denies the possibility of pregnancy. She denies any other complaints.  Past Medical History:  Diagnosis Date  . Abnormal Pap smear of cervix    12/15/14 - ASCUS + HPV  . Medical history non-contributory   . Vaginal Pap smear, abnormal    06/23/14 ASCUS + HPV    Patient Active Problem List   Diagnosis Date Noted  . Supervision of high-risk pregnancy 05/23/2015  . Current pregnancy with history of preterm labor 05/23/2015  . Short cervix affecting pregnancy 05/23/2015    Past Surgical History:  Procedure Laterality Date  . ABDOMINOPLASTY  06/28/2016  . APPENDECTOMY    . CESAREAN SECTION      OB History    Gravida Para Term Preterm AB Living   7 6 3 1  0 5   SAB TAB Ectopic Multiple Live Births   0       6      Obstetric Comments   ? Pregnancy history some records show Gravida 7 others show Gravida 8       Home Medications    Prior to Admission medications   Medication Sig Start Date End Date Taking? Authorizing Provider  acetaminophen (TYLENOL) 500 MG tablet Take 500 mg by mouth every 6  (six) hours as needed for mild pain.    Historical Provider, MD  NIFEdipine (PROCARDIA) 20 MG capsule Take 1 capsule (20 mg total) by mouth 3 (three) times daily. 06/20/15   Kathrynn Running, MD  Prenatal Vit-Fe Fumarate-FA (PRENATAL MULTIVITAMIN) TABS tablet Take 1 tablet by mouth daily at 12 noon.    Historical Provider, MD  progesterone 200 MG SUPP Place 200 mg vaginally at bedtime.    Historical Provider, MD  sertraline (ZOLOFT) 50 MG tablet Take 50 mg by mouth daily.    Historical Provider, MD    Family History Family History  Problem Relation Age of Onset  . Alcohol abuse Mother   . Drug abuse Father   . Hypertension Father   . Anxiety disorder Maternal Aunt   . Cancer Maternal Grandmother     Social History Social History  Substance Use Topics  . Smoking status: Never Smoker  . Smokeless tobacco: Never Used  . Alcohol use No     Allergies   Patient has no known allergies.   Review of Systems Review of Systems  A complete 10 system review of systems was obtained and all systems are negative except as noted in the HPI and PMH.   Physical Exam Updated Vital Signs BP 142/94   Pulse 82   Temp 98.5 F (36.9 C) (Oral)  Resp 18   Ht 5\' 3"  (1.6 m)   Wt 150 lb (68 kg)   LMP 07/31/2016 (Approximate)   SpO2 100%   BMI 26.57 kg/m   Physical Exam  Constitutional: She is oriented to person, place, and time. She appears well-developed and well-nourished. No distress.  HENT:  Head: Normocephalic and atraumatic.  Eyes: Conjunctivae and EOM are normal.  Neck: Neck supple. No tracheal deviation present.  Cardiovascular: Normal rate.   Pulmonary/Chest: Effort normal. No respiratory distress.  Musculoskeletal: Normal range of motion.  Neurological: She is alert and oriented to person, place, and time.  Skin: Skin is warm and dry.  Psychiatric: She has a normal mood and affect. Her behavior is normal.  Nursing note and vitals reviewed.    ED Treatments / Results    Labs (all labs ordered are listed, but only abnormal results are displayed) Labs Reviewed  COMPREHENSIVE METABOLIC PANEL - Abnormal; Notable for the following:       Result Value   Potassium 3.2 (*)    Total Bilirubin 0.2 (*)    All other components within normal limits  URINALYSIS, ROUTINE W REFLEX MICROSCOPIC - Abnormal; Notable for the following:    Hgb urine dipstick SMALL (*)    Bacteria, UA RARE (*)    Squamous Epithelial / LPF 0-5 (*)    All other components within normal limits  CBC WITH DIFFERENTIAL/PLATELET  I-STAT BETA HCG BLOOD, ED ([facility], [facility], [facility] ONLY)    EKG  EKG Interpretation None       Radiology No results found.  Procedures Procedures (including critical care time)  DIAGNOSTIC STUDIES: Oxygen Saturation is 98% on RA, normal by my interpretation.    COORDINATION OF CARE: 12:57 AM Discussed treatment plan with pt at bedside and pt agreed to plan.  Medications Ordered in ED Medications - No data to display   Initial Impression / Assessment and Plan / ED Course  I have reviewed the triage vital signs and the nursing notes.  Pertinent labs & imaging results that were available during my care of the patient were reviewed by me and considered in my medical decision making (see chart for details).     Patient with infected surgical wound. Patient has been I indeed here in the emergency department by Dr. gross. She will follow up with [facility] surgery. Given Ancef and vancomycin here in the emergency department. Patient will be discharged with clindamycin, pain medications. Discussed return precautions. Her safe for discharge at this time  Final Clinical Impressions(s) / ED Diagnoses   Final diagnoses:  Cellulitis of abdominal wall    New Prescriptions New Prescriptions   No medications on file   I personally performed the services described in this documentation, which was scribed in my presence. The recorded information has been reviewed and  is accurate.       Arthor Captainbigail Claudie Rathbone, PA-C 09/01/16 0548    Dione Boozeavid Glick, MD 09/01/16 [id number]    Dione Boozeavid Glick, MD 09/01/16 [id number]

## 2016-09-01 NOTE — Discharge Instructions (Signed)
Completed the antibiotics to help resolve the cellulitis abdominal wall infection.  Pack the wound every day with gauze to help encourage the wound to gradually heal and close up.  WOUND CARE  It is important that the wound be kept open.   -Keeping the skin edges apart will allow the wound to gradually heal from the base upwards.   - If the skin edges of the wound close too early, a new fluid pocket can form and infection can occur. -This is the reason to pack deeper wounds with gauze or ribbon -This is why drained wounds cannot be sewed closed right away  A healthy wound should form a lining of bright red "beefy" granulating tissue that will help shrink the wound and help the edges grow new skin into it.   -A little mucus / yellow discharge is normal (the body's natural way to try and form a scab) and should be gently washed off with soap and water with daily dressing changes.  -Green or foul smelling drainage implies bacterial colonization and can slow wound healing - a short course of antibiotic ointment (3-5 days) can help it clear up.  Call the doctor if it does not improve or worsens  -Avoid use of antibiotic ointments for more than a week as they can slow wound healing over time.    -Sometimes other wound care products will be used to reduce need for dressing changes and/or help clean up dirty wounds -Sometimes the surgeon needs to debride the wound in the office to remove dead or infected tissue out of the wound so it can heal more quickly and safely.    Change the dressing at least once a day -Wash the wound with mild soap and water gently every day.  It is good to shower or bathe the wound to help it clean out. -Use clean 4x4 gauze for medium/large wounds or ribbon plain NU-gauze for smaller wounds (it does not need to be sterile, just clean) -Keep the raw wound moist with a little saline or KY (saline) gel on the gauze.  -A dry wound will take longer to heal.  -Keep the skin dry  around the wound to prevent breakdown and irritation. -Pack the wound down to the base -The goal is to keep the skin apart, not overpack the wound -Use a Q-tip or blunt-tipped kabob stick toothpick to push the gauze down to the base in narrow or deep wounds   -Cover with a clean gauze and tape -paper or Medipore tape tend to be gentle on the skin -rotate the orientation of the tape to avoid repeated stress/trauma on the skin -using an ACE or Coban wrap on wounds on arms or legs can be used instead.  Complete all antibiotics through the entire prescription to help the infection heal and prevent new places of infection   Returning the see the surgeon is helpful to follow the healing process and help the wound close as fast as possible.   Cellulitis, Adult Cellulitis is a skin infection. The infected area is usually red and tender. This condition occurs most often in the arms and lower legs. The infection can travel to the muscles, blood, and underlying tissue and become serious. It is very important to get treated for this condition. What are the causes? Cellulitis is caused by bacteria. The bacteria enter through a break in the skin, such as a cut, burn, insect bite, open sore, or crack. What increases the risk? This condition is more likely  to occur in people who:  Have a weak defense system (immune system).  Have open wounds on the skin such as cuts, burns, bites, and scrapes. Bacteria can enter the body through these open wounds.  Are older.  Have diabetes.  Have a type of long-lasting (chronic) liver disease (cirrhosis) or kidney disease.  Use IV drugs. What are the signs or symptoms? Symptoms of this condition include:  Redness, streaking, or spotting on the skin.  Swollen area of the skin.  Tenderness or pain when an area of the skin is touched.  Warm skin.  Fever.  Chills.  Blisters. How is this diagnosed? This condition is diagnosed based on a medical history  and physical exam. You may also have tests, including:  Blood tests.  Lab tests.  Imaging tests. How is this treated? Treatment for this condition may include:  Medicines, such as antibiotic medicines or antihistamines.  Supportive care, such as rest and application of cold or warm cloths (cold or warm compresses) to the skin.  Hospital care, if the condition is severe. The infection usually gets better within 1-2 days of treatment. Follow these instructions at home:  Take over-the-counter and prescription medicines only as told by your health care provider.  If you were prescribed an antibiotic medicine, take it as told by your health care provider. Do not stop taking the antibiotic even if you start to feel better.  Drink enough fluid to keep your urine clear or pale yellow.  Do not touch or rub the infected area.  Raise (elevate) the infected area above the level of your heart while you are sitting or lying down.  Apply warm or cold compresses to the affected area as told by your health care provider.  Keep all follow-up visits as told by your health care provider. This is important. These visits let your health care provider make sure a more serious infection is not developing. Contact a health care provider if:  You have a fever.  Your symptoms do not improve within 1-2 days of starting treatment.  Your bone or joint underneath the infected area becomes painful after the skin has healed.  Your infection returns in the same area or another area.  You notice a swollen bump in the infected area.  You develop new symptoms.  You have a general ill feeling (malaise) with muscle aches and pains. Get help right away if:  Your symptoms get worse.  You feel very sleepy.  You develop vomiting or diarrhea that persists.  You notice red streaks coming from the infected area.  Your red area gets larger or turns dark in color. This information is not intended to replace  advice given to you by your health care provider. Make sure you discuss any questions you have with your health care provider. Document Released: 04/27/2005 Document Revised: 11/26/2015 Document Reviewed: 05/27/2015 [employer] Interactive Patient Education  2017 [employer]

## 2016-09-01 NOTE — Consult Note (Signed)
[facility]  [address] [place], [place] [address] Phone: [phone] FAX: [phone]     Nicole Pollard  05-Sep-1980 [id number]  CARE TEAM:  PCP: No PCP Per Patient  Outpatient Care Team: Patient Care Team: No Pcp Per Patient as PCP - General (General Practice)  Inpatient Treatment Team: Treatment Team: Attending Provider: Delora Fuel, MD; Respiratory Therapist: Nelly Laurence, RRT; Physician Assistant: Margarita Mail, PA-C; Registered Nurse: Susette Racer, RN; Technician: Rachael Darby, NT; Consulting Physician: Nolon Nations, MD   This patient is a 36 y.o.female who presents today for surgical evaluation at the request of Margarita Mail, [place] & Dr Roxanne Mins, [facility].   Reason for evaluation: Cellulitis at abdominoplasty.  Possible abscess.  36 year old pleasant female who had an abdominoplasty done down in [place] because it was more affordable.  Lives up in this region though.  Noted that the central suprapubic region of the incision has never really healed up completely.  It will get swollen and red.  Occasionally will get some drainage.  It will intermittently close up and then open back up again.  She recalls feeling a tail of a stitch exposed.  She tried to remove it but can only get a few millimeters.    She has gone to [facility] emergency to have it evaluated.  Placed on antibiotics.  Only helps a little bit and then it comes back.  This past week it became much more red and concerning.  Came to the [facility]  emergency room.  Area of cellulitis noted with some blistering and epidermolysis.  6 x 5 cm firm region.  Concerning small fluid collection noted in the abdominal wall by CT scan.  Emergency room staff not comfortable with proceeding with incision and drainage.  They wished surgery to evaluate the patient.  Plastic surgery not available.  Therefore they called general surgery since its 3 in the morning.  She does not smoke.  She is  nondiabetic.  If MRSA other skin infections.  She is never really had any other abdominal surgeries.  Incisions around the bellybutton and flanks without problems.  She is not immunosuppressed.  No fall or trauma.  No other infections or colds.  No urinary tract infections.  No diarrhea.  No nausea or vomiting.  Assessment  Nicole Pollard  36 y.o. female       Problem List:  Principal Problem:   Cellulitis of abdominal wall Active Problems:   Status post abdominoplasty Nov 2017   Postoperative stitch abscess s/p stitch removal & I&D 09/02/2015   Cellulitis of abdominal wall at abdominoplasty with history and physical strongly Suspicious for stitch abscess  Plan:  I did incision and drainage and was able to excise a clear monofilament suture, consistent with foreign body reaction stitch abscess resulting in cellulitis:   The pathophysiology of subcutaneous abscess and differential diagnosis was discussed.  Natural history progression was discussed.  The patient's symptoms are not adequately controlled.  Non-operative treatment has not healed the abscess.  Therefore, I recommended incision & drainage of the abscess to allow the infection to resolve and heal.  Technique, risks, benefits, alternatives discussed.  The patient expressed understanding & wished to proceed.  I placed a field block with local 1% lidocaine anaesthetic Over the area of fluctuance in the suprapubic abdominoplasty incision slightly right of midline.  Just above the pubic hairline.  I incised the skin over the abscess to release the infection.   Came  in to some necrotic fat but no real purulence.  I carefully probed the wound sharply and bluntly underneath the most indurated areas.  Then she was able to come around a densely fibrotic region that had a clear monofilament suture and not.  Dissected around it and removed it.  This seen to be the center of the inflammation and fat necrosis.  Suspicious for a stitch abscess, even  though it looks like a absorbable PDS to me.  I excised a little necrotic skin to have an adequate 2x1cm opening for drainage & prevent skin reclosure.  I packed the wound 3cm deep with 4x4 gauze.  The patient tolerated the procedure.    -IV vancomycin and cefazolin.  PO doxycycline x 7 days  I recommend that she pack the wound every day.  Okay to shower with soap & water into the wound.  Pack with clean gauze.  She said she was comfortable doing that by herself.  The wound should gradually heal.  I updated the patient's status to the patient and nurse.  Dr Roxanne Mins as well.  Recommendations were made.  Questions were answered.  They expressed understanding & appreciation.   Ideally follow up with the plastic surgeon in [place] or at least in town, but I doubt that is ever going to happen.  We will have the patient return to clinic for close follow up to make sure the infection heals.   -VTE prophylaxis- SCDs, etc -mobilize as tolerated to help recovery    Adin Hector, M.D., F.A.C.S. Gastrointestinal and Minimally Invasive Surgery [facility] [address], [place], [place] [address] [phone] Main / Paging   09/01/2016      Past Medical History:  Diagnosis Date  . Abnormal Pap smear of cervix    12/15/14 - ASCUS + HPV  . Medical history non-contributory   . Vaginal Pap smear, abnormal    06/23/14 ASCUS + HPV    Past Surgical History:  Procedure Laterality Date  . ABDOMINOPLASTY  06/28/2016   [place], [place]  . APPENDECTOMY    . CESAREAN SECTION      Social History   Social History  . Marital status: Married    Spouse name: N/A  . Number of children: N/A  . Years of education: N/A   Occupational History  . Not on file.   Social History Main Topics  . Smoking status: Never Smoker  . Smokeless tobacco: Never Used  . Alcohol use No  . Drug use: No  . Sexual activity: Yes   Other Topics Concern  . Not on file   Social  History Narrative  . No narrative on file    Family History  Problem Relation Age of Onset  . Alcohol abuse Mother   . Drug abuse Father   . Hypertension Father   . Anxiety disorder Maternal Aunt   . Cancer Maternal Grandmother     Current Facility-Administered Medications  Medication Dose Route Frequency Provider Last Rate Last Dose  . ceFAZolin (ANCEF) IVPB 2g/100 mL premix  2 g Intravenous Q8H Michael Boston, MD      . iopamidol (ISOVUE-300) 61 % injection           . sodium chloride 0.9 % injection           . vancomycin (VANCOCIN) IVPB 1000 mg/200 mL premix  1,000 mg Intravenous Once Margarita Mail, PA-C 200 mL/hr at 09/01/16 0353 1,000 mg at 09/01/16 0353   Current  Outpatient Prescriptions  Medication Sig Dispense Refill  . acetaminophen (TYLENOL) 500 MG tablet Take 500 mg by mouth every 6 (six) hours as needed for mild pain.    Marland Kitchen sertraline (ZOLOFT) 100 MG tablet Take 100 mg by mouth daily.    Marland Kitchen doxycycline (VIBRA-TABS) 100 MG tablet Take 1 tablet (100 mg total) by mouth 2 (two) times daily. 14 tablet 2  . naproxen (NAPROSYN) 500 MG tablet Take 1 tablet (500 mg total) by mouth every 12 (twelve) hours as needed for mild pain or moderate pain. 30 tablet 1  . NIFEdipine (PROCARDIA) 20 MG capsule Take 1 capsule (20 mg total) by mouth 3 (three) times daily. (Patient not taking: Reported on 09/01/2016) 30 capsule 1     No Known Allergies  ROS: Constitutional:  No fevers, chills, sweats.  Weight stable Eyes:  No vision changes, No discharge HENT:  No sore throats, nasal drainage Lymph: No neck swelling, No bruising easily Pulmonary:  No cough, productive sputum CV: No orthopnea, PND  Patient walks 30 minutes for about 1 miles without difficulty.  No exertional chest/neck/shoulder/arm pain. GI: No personal nor family history of GI/colon cancer, inflammatory bowel disease, irritable bowel syndrome, allergy such as Celiac Sprue, dietary/dairy problems, colitis, ulcers nor gastritis.   No recent sick contacts/gastroenteritis.  No travel outside the country.  No changes in diet. Renal: No UTIs, No hematuria Genital:  No drainage, bleeding, masses Musculoskeletal: No severe joint pain.  Good ROM major joints Skin:  No sores or lesions.  No rashes Heme/Lymph:  No easy bleeding.  No swollen lymph nodes Neuro: No focal weakness/numbness.  No seizures Psych: No suicidal ideation.  No hallucinations  BP 116/77   Pulse 91   Temp 98.5 F (36.9 C) (Oral)   Resp 18   Ht '5\' 3"'  (1.6 m)   Wt 68 kg (150 lb)   LMP 07/31/2016 (Approximate)   SpO2 98%   BMI 26.57 kg/m   Physical Exam: General: Pt awake/alert/oriented x4 in mild acute distress.  Not toxic/sickly Eyes: PERRL, normal EOM. Sclera nonicteric Neuro: CN II-XII intact w/o focal sensory/motor deficits. Lymph: No head/neck/groin lymphadenopathy Psych:  No delerium/psychosis/paranoia HENT: Normocephalic, Mucus membranes moist.  No thrush Neck: Supple, No tracheal deviation Chest: No pain.  Good respiratory excursion. CV:  Pulses intact.  Regular rhythm  Abdomen: Soft, Nondistended.  Nontender.  No incarcerated hernias.  Circular pink periumbilical incision consistent with abdominoplasty.  Well healed without cellulitis Long suprapubic abdominal plasty transverse incision from hip to hip.  Most of it closed with normal healing ridge.   Central 6 x 5 cm area of swelling & induration Along the incision just above the pubic hair line slightly right of midline.  No fluctuance.  2 x 1 cm area of skin blistering and discoloration.  No definite pus or fluid expressed by me but apparently earlier in the visit there was.  Gen:  No inguinal hernias.  No inguinal lymphadenopathy.   Ext:  SCDs BLE.  No significant edema.  No cyanosis Skin: No petechiae / purpurea.  No major sores.  Numerous tatoos Musculoskeletal: No severe joint pain.  Good ROM major joints   Results:   Labs: Results for orders placed or performed during the  hospital encounter of 08/31/16 (from the past 48 hour(s))  Urinalysis, Routine w reflex microscopic     Status: Abnormal   Collection Time: 09/01/16 12:07 AM  Result Value Ref Range   Color, Urine YELLOW YELLOW   APPearance CLEAR CLEAR  Specific Gravity, Urine 1.013 1.005 - 1.030   pH 6.0 5.0 - 8.0   Glucose, UA NEGATIVE NEGATIVE mg/dL   Hgb urine dipstick SMALL (A) NEGATIVE   Bilirubin Urine NEGATIVE NEGATIVE   Ketones, ur NEGATIVE NEGATIVE mg/dL   Protein, ur NEGATIVE NEGATIVE mg/dL   Nitrite NEGATIVE NEGATIVE   Leukocytes, UA NEGATIVE NEGATIVE   RBC / HPF 0-5 0 - 5 RBC/hpf   WBC, UA 0-5 0 - 5 WBC/hpf   Bacteria, UA RARE (A) NONE SEEN   Squamous Epithelial / LPF 0-5 (A) NONE SEEN   Mucous PRESENT   Comprehensive metabolic panel     Status: Abnormal   Collection Time: 09/01/16 12:11 AM  Result Value Ref Range   Sodium 138 135 - 145 mmol/L   Potassium 3.2 (L) 3.5 - 5.1 mmol/L   Chloride 103 101 - 111 mmol/L   CO2 24 22 - 32 mmol/L   Glucose, Bld 96 65 - 99 mg/dL   BUN 9 6 - 20 mg/dL   Creatinine, Ser 0.48 0.44 - 1.00 mg/dL   Calcium 9.2 8.9 - 10.3 mg/dL   Total Protein 8.1 6.5 - 8.1 g/dL   Albumin 4.7 3.5 - 5.0 g/dL   AST 21 15 - 41 U/L   ALT 24 14 - 54 U/L   Alkaline Phosphatase 73 38 - 126 U/L   Total Bilirubin 0.2 (L) 0.3 - 1.2 mg/dL   GFR calc non Af Amer >60 >60 mL/min   GFR calc Af Amer >60 >60 mL/min    Comment: (NOTE) The eGFR has been calculated using the CKD EPI equation. This calculation has not been validated in all clinical situations. eGFR's persistently <60 mL/min signify possible Chronic Kidney Disease.    Anion gap 11 5 - 15  CBC with Differential     Status: Abnormal   Collection Time: 09/01/16 12:11 AM  Result Value Ref Range   WBC 11.0 (H) 4.0 - 10.5 K/uL   RBC 4.13 3.87 - 5.11 MIL/uL   Hemoglobin 11.5 (L) 12.0 - 15.0 g/dL   HCT 35.3 (L) 36.0 - 46.0 %   MCV 85.5 78.0 - 100.0 fL   MCH 27.8 26.0 - 34.0 pg   MCHC 32.6 30.0 - 36.0 g/dL   RDW  13.9 11.5 - 15.5 %   Platelets 394 150 - 400 K/uL   Neutrophils Relative % 61 %   Neutro Abs 6.8 1.7 - 7.7 K/uL   Lymphocytes Relative 24 %   Lymphs Abs 2.6 0.7 - 4.0 K/uL   Monocytes Relative 13 %   Monocytes Absolute 1.4 (H) 0.1 - 1.0 K/uL   Eosinophils Relative 1 %   Eosinophils Absolute 0.1 0.0 - 0.7 K/uL   Basophils Relative 1 %   Basophils Absolute 0.1 0.0 - 0.1 K/uL  I-Stat beta hCG blood, ED     Status: None   Collection Time: 09/01/16 12:22 AM  Result Value Ref Range   I-stat hCG, quantitative <5.0 <5 mIU/mL   Comment 3            Comment:   GEST. AGE      CONC.  (mIU/mL)   <=1 WEEK        5 - 50     2 WEEKS       50 - 500     3 WEEKS       100 - 10,000     4 WEEKS     1,000 - 30,000  FEMALE AND NON-PREGNANT FEMALE:     LESS THAN 5 mIU/mL     Imaging / Studies: Ct Abdomen Pelvis W Contrast  Result Date: 09/01/2016 CLINICAL DATA:  Persistent drainage from surgical incision. Abdominoplasty 06/28/2016. EXAM: CT ABDOMEN AND PELVIS WITH CONTRAST TECHNIQUE: Multidetector CT imaging of the abdomen and pelvis was performed using the standard protocol following bolus administration of intravenous contrast. CONTRAST:  130m ISOVUE-300 IOPAMIDOL (ISOVUE-300) INJECTION 61% COMPARISON:  None. FINDINGS: Lower chest: No acute abnormality. Hepatobiliary: Liver appears normal. At least 3 gallbladder calculi are present, measuring 2-3 mm each. No bile duct dilatation. Pancreas: Unremarkable. No pancreatic ductal dilatation or surrounding inflammatory changes. Spleen: Normal in size without focal abnormality. Adrenals/Urinary Tract: Both adrenals are normal. Lower pole right collecting system calculus measuring 2 x 4 mm. No ureteral calculi. Urinary bladder is unremarkable. Stomach/Bowel: Stomach is within normal limits. Colon appears normal. No evidence of bowel wall thickening, distention, or inflammatory changes. Vascular/Lymphatic: No significant vascular findings are present. No enlarged  abdominal or pelvic lymph nodes. Reproductive: Uterus and bilateral adnexa are unremarkable. IUD noted. Probable adhesions from the anterior fundus to the undersurface of the anterior abdominal wall, best seen on sagittal images. Other: Inflammatory stranding in the subcutaneous fat of the anterior abdomen and mild irregularity of the abdominal wall musculature, likely related to the described history of abdominoplasty. Approximately 9 cm below the umbilicus, there is a tiny fluid collection at the superficial aspect of the abdominal wall musculature measuring only about 0.8 x 1.2 x 1.9 cm. No intramuscular collection. No collection deep to the abdominal wall musculature. Musculoskeletal: No significant skeletal lesion. IMPRESSION: 1. Tiny fluid collection superficial to the abdominal wall musculature in the subcutaneous tissues approximately 9 cm caudal to the umbilicus. This measures only 0.8 x 1.2 x 1.9 cm. There also is mild inflammatory stranding in the surrounding subcutaneous fat. No intramuscular collection. No collections deep to the abdominal wall musculature. 2. Probable adhesion between the uterine fundus and the undersurface of the anterior abdominal wall. 3. Cholelithiasis. Electronically Signed   By: DAndreas NewportM.D.   On: 09/01/2016 02:00    Medications / Allergies: per chart  Antibiotics: Anti-infectives    Start     Dose/Rate Route Frequency Ordered Stop   09/01/16 0400  ceFAZolin (ANCEF) IVPB 2g/100 mL premix     2 g 200 mL/hr over 30 Minutes Intravenous Every 8 hours 09/01/16 0357     09/01/16 0330  vancomycin (VANCOCIN) IVPB 1000 mg/200 mL premix     1,000 mg 200 mL/hr over 60 Minutes Intravenous  Once 09/01/16 0326     09/01/16 0000  doxycycline (VIBRA-TABS) 100 MG tablet     100 mg Oral 2 times daily 09/01/16 0400          Note: Portions of this report may have been transcribed using voice recognition software. Every effort was made to ensure accuracy; however,  inadvertent computerized transcription errors may be present.   Any transcriptional errors that result from this process are unintentional.    SAdin Hector M.D., F.A.C.S. Gastrointestinal and Minimally Invasive Surgery [facility] [address] [place] [phone] / Paging   09/01/2016

## 2017-01-30 ENCOUNTER — Encounter: Payer: Self-pay | Admitting: *Deleted

## 2017-02-09 ENCOUNTER — Ambulatory Visit (INDEPENDENT_AMBULATORY_CARE_PROVIDER_SITE_OTHER): Payer: Self-pay | Admitting: Obstetrics & Gynecology

## 2017-02-09 ENCOUNTER — Encounter: Payer: Self-pay | Admitting: Obstetrics & Gynecology

## 2017-02-09 VITALS — BP 116/70 | HR 83 | Resp 18 | Ht 63.0 in | Wt 155.0 lb

## 2017-02-09 DIAGNOSIS — N879 Dysplasia of cervix uteri, unspecified: Secondary | ICD-10-CM

## 2017-02-09 DIAGNOSIS — Z01419 Encounter for gynecological examination (general) (routine) without abnormal findings: Secondary | ICD-10-CM

## 2017-02-09 DIAGNOSIS — Z1151 Encounter for screening for human papillomavirus (HPV): Secondary | ICD-10-CM

## 2017-02-09 DIAGNOSIS — K5900 Constipation, unspecified: Secondary | ICD-10-CM

## 2017-02-09 DIAGNOSIS — Z124 Encounter for screening for malignant neoplasm of cervix: Secondary | ICD-10-CM

## 2017-02-09 DIAGNOSIS — Z30431 Encounter for routine checking of intrauterine contraceptive device: Secondary | ICD-10-CM

## 2017-02-09 NOTE — Progress Notes (Signed)
GYNECOLOGY ANNUAL PREVENTATIVE CARE ENCOUNTER NOTE  Subjective:   Nicole Pollard is a [age] y.o. [phone] female here for a routine annual gynecologic exam.  Current complaints: reports hair loss, weight gain and mood changes; but reports having Paragard IUD in place.  Does not understand what is causing these symptoms as she is not taking any hormones; have been present for several months. Had normal TSH at outside office.  Also reports chronic constipation, not alleviated by OTC remedies, has been ongoing for years. Has BM once every week with aid of laxatives and enema.   Denies abnormal vaginal bleeding, has regular periods, no abnormal discharge, pelvic pain, problems with intercourse or other gynecologic concerns.    Gynecologic History Patient's last menstrual period was 12/25/2016. Contraception: [facility], note reviewed on Care Everywhere Last Pap: 09/09/2015. Results were: normal. Had concurrent colposcopy that was benign; this was done for history of ASCUS +HRHPV pap smear on 12/15/14 and 06/23/14 (first abnormal pap smear).   Obstetric History OB History  Gravida Para Term Preterm AB Living  7 6 3 1  0 6  SAB TAB Ectopic Multiple Live Births  0       7    # Outcome Date GA Lbr Len/2nd Weight Sex Delivery Anes PTL Lv  7 Para 10/02/14 3268w0d  1 lb 3.1 oz (0.541 kg)    Y ND  6 Term 2012 6859w3d  9 lb 3 oz (4.167 kg) M   N LIV  5 Term 2011 3250w0d  5 lb 3 oz (2.353 kg) M   N LIV  4 Term 2008 21103w0d  7 lb 15 oz (3.6 kg) M   N LIV  3 Para 2006 7150w0d  6 lb 4 oz (2.835 kg) F Vag-Spont  N LIV  2 Preterm 2005 169w0d  4 lb 3 oz (1.899 kg) M   Y LIV  1 Gravida     F Vag-Spont   LIV    Obstetric Comments  ? Pregnancy history some records show Gravida 7 others show Gravida 8    Past Medical History:  Diagnosis Date  . Abnormal Pap smear of cervix    12/15/14 - ASCUS + HPV  . Anxiety 01/28/2015   Overview:  On zoloft 50mg   EPDS 7 on 03/18/15 Mood stable  Last Assessment  & Plan:  Pt states that her mood is stable on zoloft 50mg   . Cellulitis of abdominal wall 09/01/2016  . Medical history non-contributory   . Short cervix affecting pregnancy 05/23/2015   [facility]   . Vaginal Pap smear, abnormal    06/23/14 ASCUS + HPV    Past Surgical History:  Procedure Laterality Date  . ABDOMINOPLASTY  06/28/2016   [place]  . APPENDECTOMY    . CESAREAN SECTION      No current outpatient prescriptions on file prior to visit.   No current facility-administered medications on file prior to visit.     No Known Allergies  Social History   Social History  . Marital status: Married    Spouse name: N/A  . Number of children: N/A  . Years of education: N/A   Occupational History  . Not on file.   Social History Main Topics  . Smoking status: Never Smoker  . Smokeless tobacco: Never Used  . Alcohol use Yes     Comment: Social  . Drug use: No  . Sexual activity: Yes    Birth control/  protection: IUD   Other Topics Concern  . Not on file   Social History Narrative  . No narrative on file    Family History  Problem Relation Age of Onset  . Alcohol abuse Mother   . Drug abuse Father   . Hypertension Father   . Anxiety disorder Maternal Aunt   . Cancer Maternal Grandmother        Cervical    The following portions of the patient's history were reviewed and updated as appropriate: allergies, current medications, past family history, past medical history, past social history, past surgical history and problem list.  Review of Systems Pertinent items noted in HPI and remainder of comprehensive ROS otherwise negative.   Objective:  BP 116/70   Pulse 83   Resp 18   Ht 5\' 3"  (1.6 m)   Wt 155 lb (70.3 kg)   LMP 12/25/2016   BMI 27.46 kg/m  CONSTITUTIONAL: Well-developed, well-nourished female in no acute distress.  HENT:  Normocephalic, atraumatic, External right and left ear normal. Oropharynx is clear and  moist EYES: Conjunctivae and EOM are normal. Pupils are equal, round, and reactive to light. No scleral icterus.  NECK: Normal range of motion, supple, no masses.  Normal thyroid.  SKIN: Skin is warm and dry. No rash noted. Not diaphoretic. No erythema. No pallor. NEUROLOGIC: Alert and oriented to person, place, and time. Normal reflexes, muscle tone coordination. No cranial nerve deficit noted. PSYCHIATRIC: Normal mood and affect. Normal behavior. Normal judgment and thought content. CARDIOVASCULAR: Normal heart rate noted, regular rhythm RESPIRATORY: Clear to auscultation bilaterally. Effort and breath sounds normal, no problems with respiration noted. BREASTS: Symmetric in size. No masses, skin changes, nipple drainage, or lymphadenopathy. ABDOMEN: Soft, normal bowel sounds, no distention noted.  No tenderness, rebound or guarding.  PELVIC: Normal appearing external genitalia; normal appearing vaginal mucosa and cervix.  No abnormal discharge noted.  Pap smear obtained. Dark, grey IUD strings visualized about 3 cm in length from cervix.   Normal uterine size, no other palpable masses, no uterine or adnexal tenderness. MUSCULOSKELETAL: Normal range of motion. No tenderness.  No cyanosis, clubbing, or edema.  2+ distal pulses.   Assessment and Plan:  1. Constipation, unspecified constipation type Patient needs GI consult; she declines this for now.  2. Family planning, IUD (intrauterine device) check/reinsertion/removal Grey strings seen which is indicative of a Mirena, but notes say Paragard was placed. If she does have a Mirena, that could explain her side effects.  Patient will decide if she wants to remove the IUD and be sure of what type she has; wants nonhormonal IUD.  Declines removal today.  3. Cervical dysplasia 4. Encounter for gynecological examination with Papanicolaou smear of cervix - Cytology - PAP. Will follow up results of pap smear and manage accordingly. Routine  preventative health maintenance measures emphasized. Please refer to After Visit Summary for other counseling recommendations.    Jaynie Collins, MD, FACOG Attending Obstetrician & Gynecologist, [facility]

## 2017-02-09 NOTE — Patient Instructions (Signed)
Constipation, Adult Constipation is when a person has fewer bowel movements in a week than normal, has difficulty having a bowel movement, or has stools that are dry, hard, or larger than normal. Constipation may be caused by an underlying condition. It may become worse with age if a person takes certain medicines and does not take in enough fluids. Follow these instructions at home: Eating and drinking   Eat foods that have a lot of fiber, such as fresh fruits and vegetables, whole grains, and beans.  Limit foods that are high in fat, low in fiber, or overly processed, such as french fries, hamburgers, cookies, candies, and soda.  Drink enough fluid to keep your urine clear or pale yellow. General instructions  Exercise regularly or as told by your health care provider.  Go to the restroom when you have the urge to go. Do not hold it in.  Take over-the-counter and prescription medicines only as told by your health care provider. These include any fiber supplements.  Practice pelvic floor retraining exercises, such as deep breathing while relaxing the lower abdomen and pelvic floor relaxation during bowel movements.  Watch your condition for any changes.  Keep all follow-up visits as told by your health care provider. This is important. Contact a health care provider if:  You have pain that gets worse.  You have a fever.  You do not have a bowel movement after 4 days.  You vomit.  You are not hungry.  You lose weight.  You are bleeding from the anus.  You have thin, pencil-like stools. Get help right away if:  You have a fever and your symptoms suddenly get worse.  You leak stool or have blood in your stool.  Your abdomen is bloated.  You have severe pain in your abdomen.  You feel dizzy or you faint. This information is not intended to replace advice given to you by your health care provider. Make sure you discuss any questions you have with your health care  provider. Document Released: 04/15/2004 Document Revised: 02/05/2016 Document Reviewed: 01/06/2016 Elsevier Interactive Patient Education  2017 [place] 18-39 Years, Female Preventive care refers to lifestyle choices and visits with your health care provider that can promote health and wellness. What does preventive care include?  A yearly physical exam. This is also called an annual well check.  Dental exams once or twice a year.  Routine eye exams. Ask your health care provider how often you should have your eyes checked.  Personal lifestyle choices, including: ? Daily care of your teeth and gums. ? Regular physical activity. ? Eating a healthy diet. ? Avoiding tobacco and drug use. ? Limiting alcohol use. ? Practicing safe sex. ? Taking vitamin and mineral supplements as recommended by your health care provider. What happens during an annual well check? The services and screenings done by your health care provider during your annual well check will depend on your age, overall health, lifestyle risk factors, and family history of disease. Counseling Your health care provider may ask you questions about your:  Alcohol use.  Tobacco use.  Drug use.  Emotional well-being.  Home and relationship well-being.  Sexual activity.  Eating habits.  Work and work Statistician.  Method of birth control.  Menstrual cycle.  Pregnancy history.  Screening You may have the following tests or measurements:  Height, weight, and BMI.  Diabetes screening. This is done by checking your blood sugar (glucose) after you have not  eaten for a while (fasting).  Blood pressure.  Lipid and cholesterol levels. These may be checked every 5 years starting at age 70.  Skin check.  Hepatitis C blood test.  Hepatitis B blood test.  Sexually transmitted disease (STD) testing.  BRCA-related cancer screening. This may be done if you have a family history of  breast, ovarian, tubal, or peritoneal cancers.  Pelvic exam and Pap test. This may be done every 3 years starting at age 62. Starting at age 39, this may be done every 5 years if you have a Pap test in combination with an HPV test.  Discuss your test results, treatment options, and if necessary, the need for more tests with your health care provider. Vaccines Your health care provider may recommend certain vaccines, such as:  Influenza vaccine. This is recommended every year.  Tetanus, diphtheria, and acellular pertussis (Tdap, Td) vaccine. You may need a Td booster every 10 years.  Varicella vaccine. You may need this if you have not been vaccinated.  HPV vaccine. If you are 11 or younger, you may need three doses over 6 months.  Measles, mumps, and rubella (MMR) vaccine. You may need at least one dose of MMR. You may also need a second dose.  Pneumococcal 13-valent conjugate (PCV13) vaccine. You may need this if you have certain conditions and were not previously vaccinated.  Pneumococcal polysaccharide (PPSV23) vaccine. You may need one or two doses if you smoke cigarettes or if you have certain conditions.  Meningococcal vaccine. One dose is recommended if you are age 76-21 years and a first-year college student living in a residence hall, or if you have one of several medical conditions. You may also need additional booster doses.  Hepatitis A vaccine. You may need this if you have certain conditions or if you travel or work in places where you may be exposed to hepatitis A.  Hepatitis B vaccine. You may need this if you have certain conditions or if you travel or work in places where you may be exposed to hepatitis B.  Haemophilus influenzae type b (Hib) vaccine. You may need this if you have certain risk factors.  Talk to your health care provider about which screenings and vaccines you need and how often you need them. This information is not intended to replace advice given to  you by your health care provider. Make sure you discuss any questions you have with your health care provider. Document Released: 09/13/2001 Document Revised: 04/06/2016 Document Reviewed: 05/19/2015 Elsevier Interactive Patient Education  2017 [employer].

## 2017-02-09 NOTE — Progress Notes (Signed)
Pt here today for annual exam, c/o chronic constipation.

## 2017-02-13 LAB — CYTOLOGY - PAP
Diagnosis: NEGATIVE
HPV: NOT DETECTED

## 2017-04-01 DIAGNOSIS — M545 Low back pain, unspecified: Secondary | ICD-10-CM | POA: Insufficient documentation

## 2017-04-01 DIAGNOSIS — Z87440 Personal history of urinary (tract) infections: Secondary | ICD-10-CM | POA: Insufficient documentation

## 2017-04-01 DIAGNOSIS — R109 Unspecified abdominal pain: Secondary | ICD-10-CM | POA: Insufficient documentation

## 2017-07-25 ENCOUNTER — Encounter (HOSPITAL_COMMUNITY): Payer: Self-pay | Admitting: Emergency Medicine

## 2017-07-25 ENCOUNTER — Emergency Department (HOSPITAL_COMMUNITY)
Admission: EM | Admit: 2017-07-25 | Discharge: 2017-07-25 | Disposition: A | Discharge status: Home / Self Care | Payer: Self-pay | Attending: Emergency Medicine | Admitting: Emergency Medicine

## 2017-07-25 ENCOUNTER — Emergency Department (HOSPITAL_COMMUNITY): Payer: Self-pay

## 2017-07-25 DIAGNOSIS — R1084 Generalized abdominal pain: Secondary | ICD-10-CM | POA: Insufficient documentation

## 2017-07-25 DIAGNOSIS — K5901 Slow transit constipation: Secondary | ICD-10-CM | POA: Insufficient documentation

## 2017-07-25 DIAGNOSIS — R6881 Early satiety: Secondary | ICD-10-CM | POA: Insufficient documentation

## 2017-07-25 LAB — URINALYSIS, ROUTINE W REFLEX MICROSCOPIC
Bilirubin Urine: NEGATIVE
Glucose, UA: NEGATIVE mg/dL
Hgb urine dipstick: NEGATIVE
Ketones, ur: NEGATIVE mg/dL
Leukocytes, UA: NEGATIVE
Nitrite: NEGATIVE
Protein, ur: NEGATIVE mg/dL
Specific Gravity, Urine: 1.011 (ref 1.005–1.030)
pH: 7 (ref 5.0–8.0)

## 2017-07-25 LAB — COMPREHENSIVE METABOLIC PANEL
ALT: 17 U/L (ref 14–54)
AST: 17 U/L (ref 15–41)
Albumin: 4.2 g/dL (ref 3.5–5.0)
Alkaline Phosphatase: 61 U/L (ref 38–126)
Anion gap: 9 (ref 5–15)
BUN: 10 mg/dL (ref 6–20)
CO2: 26 mmol/L (ref 22–32)
Calcium: 9.3 mg/dL (ref 8.9–10.3)
Chloride: 104 mmol/L (ref 101–111)
Creatinine, Ser: 0.62 mg/dL (ref 0.44–1.00)
GFR calc Af Amer: >60 mL/min (ref >60)
GFR calc non Af Amer: >60 mL/min (ref >60)
Glucose, Bld: 89 mg/dL (ref 65–99)
Potassium: 4.3 mmol/L (ref 3.5–5.1)
Sodium: 139 mmol/L (ref 135–145)
Total Bilirubin: 0.4 mg/dL (ref 0.3–1.2)
Total Protein: 7.5 g/dL (ref 6.5–8.1)

## 2017-07-25 LAB — CBC
HCT: 41 % (ref 36.0–46.0)
Hemoglobin: 13.4 g/dL (ref 12.0–15.0)
MCH: 30.1 pg (ref 26.0–34.0)
MCHC: 32.7 g/dL (ref 30.0–36.0)
MCV: 92.1 fL (ref 78.0–100.0)
Platelets: 349 10*3/uL (ref 150–400)
RBC: 4.45 MIL/uL (ref 3.87–5.11)
RDW: 12.7 % (ref 11.5–15.5)
WBC: 8.2 10*3/uL (ref 4.0–10.5)

## 2017-07-25 LAB — LIPASE, BLOOD: Lipase: 33 U/L (ref 11–51)

## 2017-07-25 LAB — POC URINE PREG, ED: Preg Test, Ur: NEGATIVE

## 2017-07-25 MED ORDER — RANITIDINE HCL 150 MG PO TABS: 150.0000 mg | ORAL_TABLET | Freq: Two times a day (BID) | ORAL | 0 refills | Status: DC

## 2017-07-25 MED ORDER — POLYETHYLENE GLYCOL 3350 17 GM/SCOOP PO POWD: 17.0000 g | Freq: Two times a day (BID) | ORAL | 0 refills | Status: DC

## 2017-07-25 MED ORDER — OMEPRAZOLE 20 MG PO CPDR: 20.0000 mg | DELAYED_RELEASE_CAPSULE | Freq: Every day | ORAL | 0 refills | Status: DC

## 2017-07-25 NOTE — ED Provider Notes (Signed)
[facility] EMERGENCY DEPARTMENT Provider Note   CSN: [id number] Arrival date & time: 07/25/17  1253     History   Chief Complaint Chief Complaint  Patient presents with  . Abdominal Pain    HPI Nicole Pollard is a 36 y.o. female.  HPI  The patient is a [age] year old female, she has a history of anxiety, she also has a history of a prior abdominal surgery where she had an appendicitis removal as well as a prior history of an "tummy tuck" which was performed approximately 13 months ago.  She reports that after the surgery she always felt like she was swollen, she has never really lost weight like she thought she would, she has had chronic constipation even since she was a child and has struggled with these things for quite some time however over the last 3 days she has had increased early satiety as well as complaining of some increased belching and gas from below.  She has had decreased frequency of her bowel movements and they are usually small and hard.  She does not take any medications at all including medications for constipation.  She is currently on the Depakote shot for birth control, she is sexually active, she has no urinary symptoms including frequency urgency or dysuria and denies any back pain fevers chills or vomiting.  She has seen a prior physician for the same complaints and was told she needed an ultrasound however she never followed up because of cost issues.  Past Medical History:  Diagnosis Date  . Abnormal Pap smear of cervix    12/15/14 - ASCUS + HPV  . Anxiety 01/28/2015   Overview:  On zoloft 50mg   EPDS 7 on 03/18/15 Mood stable  Last Assessment & Plan:  Pt states that her mood is stable on zoloft 50mg   . Cellulitis of abdominal wall 09/01/2016  . Medical history non-contributory   . Short cervix affecting pregnancy 05/23/2015   [facility]   . Vaginal Pap smear, abnormal    06/23/14 ASCUS + HPV    Patient Active Problem List   Diagnosis Date Noted  . Anxiety 01/28/2015    Past Surgical History:  Procedure Laterality Date  . ABDOMINOPLASTY  06/28/2016   [place], [place]  . APPENDECTOMY    . CESAREAN SECTION      OB History    Gravida Para Term Preterm AB Living   7 6 3 1  0 6   SAB TAB Ectopic Multiple Live Births   0       7      Obstetric Comments   ? Pregnancy history some records show Gravida 7 others show Gravida 8       Home Medications    Prior to Admission medications   Medication Sig Start Date End Date Taking? Authorizing Provider  omeprazole (PRILOSEC) 20 MG capsule Take 1 capsule (20 mg total) by mouth daily. 07/25/17   Eber HongMiller, Tonique Mendonca, MD  polyethylene glycol powder (GLYCOLAX/MIRALAX) powder Take 17 g by mouth 2 (two) times daily. Until daily soft stools  OTC 07/25/17   Eber HongMiller, Byan Poplaski, MD  ranitidine (ZANTAC) 150 MG tablet Take 1 tablet (150 mg total) by mouth 2 (two) times daily. 07/25/17   Eber HongMiller, Kasiah Manka, MD    Family History Family History  Problem Relation Age of Onset  . Alcohol abuse Mother   . Drug abuse Father   . Hypertension Father   . Anxiety disorder Maternal Aunt   . Cancer Maternal  Grandmother        Cervical    Social History Social History   Tobacco Use  . Smoking status: Never Smoker  . Smokeless tobacco: Never Used  Substance Use Topics  . Alcohol use: Yes    Comment: Social  . Drug use: No     Allergies   Patient has no known allergies.   Review of Systems Review of Systems  All other systems reviewed and are negative.    Physical Exam Updated Vital Signs BP (!) 146/93 (BP Location: Right Arm)   Pulse 99   Temp 98.4 F (36.9 C) (Oral)   Resp 16   Ht 5\' 3"  (1.6 m)   Wt 70.3 kg (155 lb)   SpO2 100%   BMI 27.46 kg/m   Physical Exam  Constitutional: She appears well-developed and well-nourished. No distress.  HENT:  Head: Normocephalic and atraumatic.  Mouth/Throat: Oropharynx is clear and moist. No oropharyngeal exudate.  Eyes:  Conjunctivae and EOM are normal. Pupils are equal, round, and reactive to light. Right eye exhibits no discharge. Left eye exhibits no discharge. No scleral icterus.  Neck: Normal range of motion. Neck supple. No JVD present. No thyromegaly present.  Cardiovascular: Normal rate, regular rhythm, normal heart sounds and intact distal pulses. Exam reveals no gallop and no friction rub.  No murmur heard. Pulmonary/Chest: Effort normal and breath sounds normal. No respiratory distress. She has no wheezes. She has no rales.  Abdominal: Soft. Bowel sounds are normal. She exhibits no distension and no mass. There is no tenderness.  Deep palpation to all 4 quadrants of the abdomen with minimal tenderness only on the left lower quadrant.  There is no significant tenderness on the right, there is no tenderness to the right upper quadrant and definitely no Murphy sign.  No peritoneal signs guarding or masses palpated  Musculoskeletal: Normal range of motion. She exhibits no edema or tenderness.  Lymphadenopathy:    She has no cervical adenopathy.  Neurological: She is alert. Coordination normal.  Skin: Skin is warm and dry. No rash noted. No erythema.  Psychiatric: She has a normal mood and affect. Her behavior is normal.  Nursing note and vitals reviewed.    ED Treatments / Results  Labs (all labs ordered are listed, but only abnormal results are displayed) Labs Reviewed  URINALYSIS, ROUTINE W REFLEX MICROSCOPIC - Abnormal; Notable for the following components:      Result Value   Color, Urine STRAW (*)    All other components within normal limits  LIPASE, BLOOD  COMPREHENSIVE METABOLIC PANEL  CBC  POC URINE PREG, ED    Radiology Dg Abdomen Acute W/chest  Result Date: 07/25/2017 CLINICAL DATA:  Chronic constipation which has worsened over the past 3 days with abdominal pain, distention and a feeling of fullness after eating. EXAM: DG ABDOMEN ACUTE W/ 1V CHEST COMPARISON:  None. FINDINGS:  Single-view of the chest demonstrates clear lungs and normal heart size. No pneumothorax or pleural effusion. Two views of the abdomen show no free intraperitoneal air. The bowel gas pattern is nonobstructive. Moderate stool burden is most notable in the ascending colon. No abnormal abdominal calcification. No acute bony abnormality. IMPRESSION: No acute abnormality. Moderate colonic stool burden is most notable in the ascending colon. Electronically Signed   By: Drusilla Kanner M.D.   On: 07/25/2017 15:17    Procedures Procedures (including critical care time)  Medications Ordered in ED Medications - No data to display   Initial Impression /  Assessment and Plan / ED Course  I have reviewed the triage vital signs and the nursing notes.  Pertinent labs & imaging results that were available during my care of the patient were reviewed by me and considered in my medical decision making (see chart for details).     The exam is unremarkable, her x-ray is also unremarkable, there is some stool burden noticeable on the right side however the left side is not overly full.  She has labs which are very normal including blood counts, urinalysis, pregnancy and metabolic panel including liver test.  I doubt that this is an acute cholecystitis or a biliary tree problem.  There is no signs of urinary problems, renal problems or other inflammatory infectious or surgical causes.  Given her increasing amounts of early satiety with gas production she may have some element of gastritis, acid reflux or even H. pylori.  We will treat her with a proton pump inhibitor, antihistamines such as Zantac and will highly suggest a stool regimen to help her get regular with outpatient follow-up.  I do not think that she needs any advanced imaging at this time given her rather unremarkable exam and normal lab test.  The patient was informed of this plan and expressed her understanding and agreement.  CT scan from February was  reviewed showing no signs of intra-abdominal pathology that was not related to the surgery as far as infectious etiologies, she healed completely from that and I doubt that she has an intra-abdominal process that is pathological    Final Clinical Impressions(s) / ED Diagnoses   Final diagnoses:  Generalized abdominal pain  Slow transit constipation  Early satiety    ED Discharge Orders        Ordered    ranitidine (ZANTAC) 150 MG tablet  2 times daily     07/25/17 1550    omeprazole (PRILOSEC) 20 MG capsule  Daily     07/25/17 1550    polyethylene glycol powder (GLYCOLAX/MIRALAX) powder  2 times daily     07/25/17 1550       Eber HongMiller, Moxon Messler, MD 07/25/17 1551

## 2017-07-25 NOTE — ED Notes (Signed)
MD Miller at bedside. 

## 2017-07-25 NOTE — ED Provider Notes (Signed)
MSE was initiated and I personally evaluated the patient and placed orders (if any) at  3:04 PM on July 25, 2017.   Nicole Pollard is a 36 y.o. female who received a medical screening exam by me, seen and evaluated for chief complaint of diffuse abdominal pain for 3 days.  No significant abdominal tenderness on exam.  Based on initial evaluation, labs pending and radiology studies are pending.  Patient counseled on process, plan, and necessity for staying for completing the evaluation.  The patient appears stable so that the remainder of the MSE may be completed by another provider.    Nicole Pollard, Nicole Karger, PA-C 07/25/17 1509    Eber HongMiller, Brian, MD 07/25/17 [phone]

## 2017-07-25 NOTE — Discharge Instructions (Signed)
MiraLAX twice a day until you are having regular soft stools  Omeprazole daily for 30 days  Zantac twice a day for 2 weeks then go to once a day.  Follow-up with your doctor for further evaluation if no significant improvement or return to the emergency department for severe or worsening symptoms including pain fever vomiting blood in the stools or jaundice  Please obtain all of your results from medical records or have your doctors office obtain the results - share them with your doctor - you should be seen at your doctors office in the next 2 days. Call today to arrange your follow up. Take the medications as prescribed. Please review all of the medicines and only take them if you do not have an allergy to them. Please be aware that if you are taking birth control pills, taking other prescriptions, ESPECIALLY ANTIBIOTICS may make the birth control ineffective - if this is the case, either do not engage in sexual activity or use alternative methods of birth control such as condoms until you have finished the medicine and your family doctor says it is OK to restart them. If you are on a blood thinner such as COUMADIN, be aware that any other medicine that you take may cause the coumadin to either work too much, or not enough - you should have your coumadin level rechecked in next 7 days if this is the case.  ?  It is also a possibility that you have an allergic reaction to any of the medicines that you have been prescribed - Everybody reacts differently to medications and while MOST people have no trouble with most medicines, you may have a reaction such as nausea, vomiting, rash, swelling, shortness of breath. If this is the case, please stop taking the medicine immediately and contact your physician.  ?  You should return to the ER if you develop severe or worsening symptoms.

## 2017-07-25 NOTE — ED Triage Notes (Signed)
Pt reports chronic constipation but worse x 3 days.  Abd pain, distension, and feeling of fullness after eating.  Started a few days ago.

## 2017-10-10 DIAGNOSIS — F410 Panic disorder [episodic paroxysmal anxiety] without agoraphobia: Secondary | ICD-10-CM | POA: Insufficient documentation

## 2017-10-10 DIAGNOSIS — R87619 Unspecified abnormal cytological findings in specimens from cervix uteri: Secondary | ICD-10-CM | POA: Insufficient documentation

## 2017-10-10 DIAGNOSIS — F4310 Post-traumatic stress disorder, unspecified: Secondary | ICD-10-CM | POA: Insufficient documentation

## 2017-10-10 DIAGNOSIS — B977 Papillomavirus as the cause of diseases classified elsewhere: Secondary | ICD-10-CM | POA: Insufficient documentation

## 2018-05-02 ENCOUNTER — Emergency Department (HOSPITAL_COMMUNITY)
Admission: EM | Admit: 2018-05-02 | Discharge: 2018-05-02 | Disposition: A | Discharge status: Home / Self Care | Payer: Self-pay | Attending: Emergency Medicine | Admitting: Emergency Medicine

## 2018-05-02 ENCOUNTER — Other Ambulatory Visit: Payer: Self-pay

## 2018-05-02 DIAGNOSIS — H539 Unspecified visual disturbance: Secondary | ICD-10-CM | POA: Insufficient documentation

## 2018-05-02 DIAGNOSIS — Z79899 Other long term (current) drug therapy: Secondary | ICD-10-CM | POA: Insufficient documentation

## 2018-05-02 NOTE — ED Provider Notes (Signed)
[facility] EMERGENCY DEPARTMENT Provider Note   CSN: [id number] Arrival date & time: 05/02/18  1907     History   Chief Complaint Chief Complaint  Patient presents with  . Decreased Visual Acuity    HPI Nicole Pollard is a 37 y.o. female.  HPI Patient came in with visual changes.  States that she was driving today and had visual changes come from the periphery of both eyes laterally that then moved centrally until involved all her vision.  States it looked like a squiggly rainbow.  After around 15 minutes it resolved gradually.  No headache.  She is now back to normal vision.  Has not had anything like this before.  States she will occasionally get some left-sided chest pain that is sharp.  She contributes it to pain from her breast implants.  She did not have pain during the visual changes. Past Medical History:  Diagnosis Date  . Abnormal Pap smear of cervix    12/15/14 - ASCUS + HPV  . Anxiety 01/28/2015   Overview:  On zoloft 50mg   EPDS 7 on 03/18/15 Mood stable  Last Assessment & Plan:  Pt states that her mood is stable on zoloft 50mg   . Cellulitis of abdominal wall 09/01/2016  . Medical history non-contributory   . Short cervix affecting pregnancy 05/23/2015   Cerclage [facility] 04/03/15 [facility]   . Vaginal Pap smear, abnormal    06/23/14 ASCUS + HPV    Patient Active Problem List   Diagnosis Date Noted  . Anxiety 01/28/2015    Past Surgical History:  Procedure Laterality Date  . ABDOMINOPLASTY  06/28/2016   [place]  . APPENDECTOMY    . CESAREAN SECTION       OB History    Gravida  7   Para  6   Term  3   Preterm  1   AB  0   Living  6     SAB  0   TAB      Ectopic      Multiple      Live Births  7        Obstetric Comments  ? Pregnancy history some records show Gravida 7 others show Gravida 8         Home Medications    Prior to Admission medications   Medication Sig Start Date End Date Taking? Authorizing Provider    omeprazole (PRILOSEC) 20 MG capsule Take 1 capsule (20 mg total) by mouth daily. 07/25/17   Eber Hong, MD  polyethylene glycol powder (GLYCOLAX/MIRALAX) powder Take 17 g by mouth 2 (two) times daily. Until daily soft stools  OTC 07/25/17   Eber Hong, MD  ranitidine (ZANTAC) 150 MG tablet Take 1 tablet (150 mg total) by mouth 2 (two) times daily. 07/25/17   Eber Hong, MD    Family History Family History  Problem Relation Age of Onset  . Alcohol abuse Mother   . Drug abuse Father   . Hypertension Father   . Anxiety disorder Maternal Aunt   . Cancer Maternal Grandmother        Cervical    Social History Social History   Tobacco Use  . Smoking status: Never Smoker  . Smokeless tobacco: Never Used  Substance Use Topics  . Alcohol use: Yes    Comment: Social  . Drug use: No     Allergies   Patient has no known allergies.   Review of Systems Review of Systems  Constitutional: Negative for fatigue.  HENT: Negative for congestion.   Eyes: Positive for visual disturbance. Negative for photophobia, pain and redness.  Respiratory: Negative for shortness of breath.   Cardiovascular: Positive for chest pain.  Gastrointestinal: Negative for abdominal pain.  Genitourinary: Negative for flank pain.  Musculoskeletal: Negative for back pain.  Skin: Negative for wound.  Neurological: Negative for dizziness, facial asymmetry, weakness and light-headedness.     Physical Exam Updated Vital Signs BP 135/90 (BP Location: Right Arm)   Pulse 86   Temp 98.6 F (37 C) (Temporal)   Resp 19   Ht 5\' 3"  (1.6 m)   Wt 70.3 kg   LMP  (LMP Unknown)   SpO2 100%   BMI 27.46 kg/m   Physical Exam  Constitutional: She appears well-developed.  HENT:  Head: Normocephalic.  Eyes: Pupils are equal, round, and reactive to light. Conjunctivae and EOM are normal. Right eye exhibits no discharge. Left eye exhibits no discharge.  Neck: Neck supple. No thyromegaly present.   Cardiovascular: Normal rate.  Abdominal: There is no tenderness.  Musculoskeletal: She exhibits no tenderness.  Neurological: She is alert.  Skin: Skin is warm. Capillary refill takes less than 2 seconds.     ED Treatments / Results  Labs (all labs ordered are listed, but only abnormal results are displayed) Labs Reviewed - No data to display  EKG None  Radiology No results found.  Procedures Procedures (including critical care time)  Medications Ordered in ED Medications - No data to display   Initial Impression / Assessment and Plan / ED Course  I have reviewed the triage vital signs and the nursing notes.  Pertinent labs & imaging results that were available during my care of the patient were reviewed by me and considered in my medical decision making (see chart for details).     Patient with episodic visual change.  Resolved.  Discussed with Dr. Alben Spittle from ophthalmology.  Will see in the office in follow-up.  No need for further work-up here.  No history of migraines but could be ocular migraine or non-cephalgia migraine.  Final Clinical Impressions(s) / ED Diagnoses   Final diagnoses:  Visual aura    ED Discharge Orders    None       Benjiman Core, MD 05/02/18 2012

## 2018-05-02 NOTE — ED Triage Notes (Signed)
Patient reports sudden appearance of "rainbows and squiggly lines." Per patient the visual disturbance lasted about 15 minutes. No history of migraines. Patient states vision is ok now.

## 2020-01-26 ENCOUNTER — Other Ambulatory Visit: Payer: Self-pay

## 2020-01-26 ENCOUNTER — Ambulatory Visit
Admission: EM | Admit: 2020-01-26 | Discharge: 2020-01-26 | Disposition: A | Discharge status: Home / Self Care | Payer: Medicaid Other | Attending: Emergency Medicine | Admitting: Emergency Medicine

## 2020-01-26 DIAGNOSIS — R109 Unspecified abdominal pain: Secondary | ICD-10-CM | POA: Insufficient documentation

## 2020-01-26 DIAGNOSIS — R350 Frequency of micturition: Secondary | ICD-10-CM | POA: Insufficient documentation

## 2020-01-26 LAB — POCT URINE PREGNANCY: Preg Test, Ur: NEGATIVE

## 2020-01-26 LAB — POCT URINALYSIS DIP (MANUAL ENTRY)
Bilirubin, UA: NEGATIVE
Glucose, UA: NEGATIVE mg/dL
Ketones, POC UA: NEGATIVE mg/dL
Leukocytes, UA: NEGATIVE
Nitrite, UA: NEGATIVE
Protein Ur, POC: NEGATIVE mg/dL
Spec Grav, UA: <=1.005 — AB (ref 1.010–1.025)
Urobilinogen, UA: 0.2 E.U./dL
pH, UA: 5.5 (ref 5.0–8.0)

## 2020-01-26 MED ORDER — IBUPROFEN 800 MG PO TABS: 800.0000 mg | ORAL_TABLET | Freq: Three times a day (TID) | ORAL | 0 refills | Status: DC

## 2020-01-26 NOTE — ED Provider Notes (Signed)
[facility]   CC: Flank pain  SUBJECTIVE:  Nicole Pollard is a 39 y.o. female who complains of urinary frequency for "a while" and left flank pain x 1 days.  Does admit to excessive caffeine intake.  Localizes the pain to the LT flank.  Pain is intermittent and describes it as throbbing.  Denies alleviating factors.  Symptoms are made worse with urination.  Admits to similar symptoms in the past.  Denies fever, chills, nausea, vomiting, abdominal pain, abnormal vaginal discharge or bleeding, hematuria.    LMP: Patient's last menstrual period was 01/24/2020.  ROS: As in HPI.  All other pertinent ROS negative.     Past Medical History:  Diagnosis Date  . Abnormal Pap smear of cervix    12/15/14 - ASCUS + HPV  . Anxiety 01/28/2015   Overview:  On zoloft 50mg   EPDS 7 on 03/18/15 Mood stable  Last Assessment & Plan:  Pt states that her mood is stable on zoloft 50mg   . Cellulitis of abdominal wall 09/01/2016  . Medical history non-contributory   . Short cervix affecting pregnancy 05/23/2015   Cerclage placed 04/03/15 at [facility]   . Vaginal Pap smear, abnormal    06/23/14 ASCUS + HPV   Past Surgical History:  Procedure Laterality Date  . ABDOMINOPLASTY  06/28/2016   [place], [place]  . APPENDECTOMY    . CESAREAN SECTION     No Known Allergies No current facility-administered medications on file prior to encounter.   Current Outpatient Medications on File Prior to Encounter  Medication Sig Dispense Refill  . omeprazole (PRILOSEC) 20 MG capsule Take 1 capsule (20 mg total) by mouth daily. 30 capsule 0  . [DISCONTINUED] ranitidine (ZANTAC) 150 MG tablet Take 1 tablet (150 mg total) by mouth 2 (two) times daily. 60 tablet 0   Social History   Socioeconomic History  . Marital status: Married    Spouse name: Not on file  . Number of children: Not on file  . Years of education: Not on file  . Highest education level: Not on file  Occupational History  . Not on file  Tobacco  Use  . Smoking status: Never Smoker  . Smokeless tobacco: Never Used  Substance and Sexual Activity  . Alcohol use: Yes    Comment: Social  . Drug use: No  . Sexual activity: Yes    Birth control/protection: Injection  Other Topics Concern  . Not on file  Social History Narrative  . Not on file   Social Determinants of Health   Financial Resource Strain:   . Difficulty of Paying Living Expenses:   Food Insecurity:   . Worried About 06/25/14 in the Last Year:   . 06/30/2016 in the Last Year:   Transportation Needs:   . Programme researcher, broadcasting/film/video (Medical):   Barista Lack of Transportation (Non-Medical):   Physical Activity:   . Days of Exercise per Week:   . Minutes of Exercise per Session:   Stress:   . Feeling of Stress :   Social Connections:   . Frequency of Communication with Friends and Family:   . Frequency of Social Gatherings with Friends and Family:   . Attends Religious Services:   . Active Member of Clubs or Organizations:   . Attends Freight forwarder Meetings:   Marland Kitchen Marital Status:   Intimate Partner Violence:   . Fear of Current or Ex-Partner:   . Emotionally Abused:   Banker Physically  Abused:   . Sexually Abused:    Family History  Problem Relation Age of Onset  . Alcohol abuse Mother   . Drug abuse Father   . Hypertension Father   . Anxiety disorder Maternal Aunt   . Cancer Maternal Grandmother        Cervical    OBJECTIVE:  Vitals:   01/26/20 1427  BP: (!) 131/91  Pulse: 84  Resp: 18  Temp: 98.7 F (37.1 C)  SpO2: 96%   General appearance: Alert in no acute distress HEENT: NCAT.  Oropharynx clear.  Lungs: clear to auscultation bilaterally without adventitious breath sounds Heart: regular rate and rhythm.   Abdomen: soft; non-distended; no tenderness; bowel sounds present; no guarding Back: no CVA tenderness Extremities: no edema; symmetrical with no gross deformities Skin: warm and dry Neurologic: Ambulates from chair to exam  table without difficulty Psychological: alert and cooperative; normal mood and affect  Labs Reviewed  POCT URINALYSIS DIP (MANUAL ENTRY) - Abnormal; Notable for the following components:      Result Value   Spec Grav, UA <=1.005 (*)    Blood, UA trace-intact (*)    All other components within normal limits  URINE CULTURE  POCT URINE PREGNANCY    ASSESSMENT & PLAN:  1. Left flank pain   2. Urinary frequency     Meds ordered this encounter  Medications  . ibuprofen (ADVIL) 800 MG tablet    Sig: Take 1 tablet (800 mg total) by mouth 3 (three) times daily.    Dispense:  21 tablet    Refill:  0    Order Specific Question:   Supervising Provider    Answer:   Raylene Everts [[id number]]   Urine without sign of infection.   Urine culture sent.  We will call you with abnormal results.   Push fluids and get plenty of rest.   Ibuprofen 800 mg prescribed for pain.  Take as directed Follow up with PCP if symptoms persists Return here or go to ER if you have any new or worsening symptoms such as fever, worsening abdominal pain, nausea/vomiting, flank pain, etc...  Outlined signs and symptoms indicating need for more acute intervention. Patient verbalized understanding. After Visit Summary given.     Lestine Box, PA-C 01/26/20 1445

## 2020-01-26 NOTE — Discharge Instructions (Signed)
Urine without sign of infection.   Urine culture sent.  We will call you with abnormal results.   Push fluids and get plenty of rest.   Ibuprofen 800 mg prescribed for pain.  Take as directed Follow up with PCP if symptoms persists Return here or go to ER if you have any new or worsening symptoms such as fever, worsening abdominal pain, nausea/vomiting, flank pain, etc..Marland Kitchen

## 2020-01-26 NOTE — ED Triage Notes (Signed)
Pt presents with c/o left flank pain and urinary frequency

## 2020-01-27 LAB — URINE CULTURE: Culture: NO GROWTH

## 2020-02-21 ENCOUNTER — Ambulatory Visit
Admission: EM | Admit: 2020-02-21 | Discharge: 2020-02-21 | Disposition: A | Discharge status: Home / Self Care | Payer: Medicaid Other | Attending: Emergency Medicine | Admitting: Emergency Medicine

## 2020-02-21 ENCOUNTER — Encounter: Payer: Self-pay | Admitting: Emergency Medicine

## 2020-02-21 DIAGNOSIS — R350 Frequency of micturition: Secondary | ICD-10-CM

## 2020-02-21 DIAGNOSIS — R300 Dysuria: Secondary | ICD-10-CM | POA: Insufficient documentation

## 2020-02-21 DIAGNOSIS — R3915 Urgency of urination: Secondary | ICD-10-CM | POA: Diagnosis present

## 2020-02-21 LAB — POCT URINALYSIS DIP (MANUAL ENTRY)
Bilirubin, UA: NEGATIVE
Glucose, UA: NEGATIVE mg/dL
Ketones, POC UA: NEGATIVE mg/dL
Leukocytes, UA: NEGATIVE
Nitrite, UA: NEGATIVE
Protein Ur, POC: NEGATIVE mg/dL
Spec Grav, UA: 1.02 (ref 1.010–1.025)
Urobilinogen, UA: 0.2 E.U./dL
pH, UA: 6 (ref 5.0–8.0)

## 2020-02-21 MED ORDER — NITROFURANTOIN MONOHYD MACRO 100 MG PO CAPS: 100.0000 mg | ORAL_CAPSULE | Freq: Two times a day (BID) | ORAL | 0 refills | Status: DC

## 2020-02-21 NOTE — Discharge Instructions (Signed)
Urine without obvious signs of infection today, however, will cover for UTI given recent surgery and symptoms Urine culture sent.  We will call you with abnormal results.   Push fluids and get plenty of rest.   Take antibiotic as directed and to completion Please call on-call surgeon today for further evaluation and managment Return here or go to ER if you have any new or worsening symptoms such as fever, worsening abdominal pain, nausea/vomiting, flank pain, etc..Marland Kitchen

## 2020-02-21 NOTE — ED Triage Notes (Signed)
Burning with urination x past few days.  Pt had a tubal ligation x 1week ago and had a catheter inserted for surgery.

## 2020-02-21 NOTE — ED Provider Notes (Signed)
[facility]   CC: Burning with urination  SUBJECTIVE:  Nicole Pollard is a 39 y.o. female who complains of urinary frequency, urgency and dysuria for the past 2-3 days.  Had tubal ligation last week.  Had catheter inserted at that time.  Complains of mild lower abdominal discomfort/ aching.  Denies alleviating factors.  Symptoms are made worse with urination.  Complains of associated fatigue and mild nausea.  Denies fever, chills, vomiting, flank pain, hematuria.    LMP: Patient's last menstrual period was 01/24/2020.  ROS: As in HPI.  All other pertinent ROS negative.     Past Medical History:  Diagnosis Date  . Abnormal Pap smear of cervix    12/15/14 - ASCUS + HPV  . Anxiety 01/28/2015   Overview:  On zoloft 50mg   EPDS 7 on 03/18/15 Mood stable  Last Assessment & Plan:  Pt states that her mood is stable on zoloft 50mg   . Cellulitis of abdominal wall 09/01/2016  . Medical history non-contributory   . Short cervix affecting pregnancy 05/23/2015   Cerclage placed 04/03/15 at [facility]   . Vaginal Pap smear, abnormal    06/23/14 ASCUS + HPV   Past Surgical History:  Procedure Laterality Date  . ABDOMINOPLASTY  06/28/2016   [place], [place]  . APPENDECTOMY    . CESAREAN SECTION    . TUBAL LIGATION     No Known Allergies No current facility-administered medications on file prior to encounter.   Current Outpatient Medications on File Prior to Encounter  Medication Sig Dispense Refill  . [DISCONTINUED] omeprazole (PRILOSEC) 20 MG capsule Take 1 capsule (20 mg total) by mouth daily. 30 capsule 0  . [DISCONTINUED] ranitidine (ZANTAC) 150 MG tablet Take 1 tablet (150 mg total) by mouth 2 (two) times daily. 60 tablet 0   Social History   Socioeconomic History  . Marital status: Married    Spouse name: Not on file  . Number of children: Not on file  . Years of education: Not on file  . Highest education level: Not on file  Occupational History  . Not on file  Tobacco Use    . Smoking status: Never Smoker  . Smokeless tobacco: Never Used  Substance and Sexual Activity  . Alcohol use: Yes    Comment: Social  . Drug use: No  . Sexual activity: Yes    Birth control/protection: Surgical  Other Topics Concern  . Not on file  Social History Narrative  . Not on file   Social Determinants of Health   Financial Resource Strain:   . Difficulty of Paying Living Expenses:   Food Insecurity:   . Worried About 06/25/14 in the Last Year:   . 06/30/2016 in the Last Year:   Transportation Needs:   . Programme researcher, broadcasting/film/video (Medical):   Barista Lack of Transportation (Non-Medical):   Physical Activity:   . Days of Exercise per Week:   . Minutes of Exercise per Session:   Stress:   . Feeling of Stress :   Social Connections:   . Frequency of Communication with Friends and Family:   . Frequency of Social Gatherings with Friends and Family:   . Attends Religious Services:   . Active Member of Clubs or Organizations:   . Attends Freight forwarder Meetings:   Marland Kitchen Marital Status:   Intimate Partner Violence:   . Fear of Current or Ex-Partner:   . Emotionally Abused:   Banker Physically Abused:   .  Sexually Abused:    Family History  Problem Relation Age of Onset  . Alcohol abuse Mother   . Drug abuse Father   . Hypertension Father   . Anxiety disorder Maternal Aunt   . Cancer Maternal Grandmother        Cervical    OBJECTIVE:  Vitals:   02/21/20 1418 02/21/20 1421  BP:  (!) 132/86  Pulse:  93  Resp:  19  Temp:  98.4 F (36.9 C)  TempSrc:  Oral  SpO2:  98%  Weight: 149 lb (67.6 kg)   Height: 5\' 3"  (1.6 m)    General appearance: Alert HEENT: NCAT.  Oropharynx clear.  Lungs: clear to auscultation bilaterally without adventitious breath sounds Heart: regular rate and rhythm.   Abdomen: healing umbilical post-surgical incisions over without surrounding erythema, drainage; soft; non-distended; no tenderness; bowel sounds present; no  guarding Back: no CVA tenderness Extremities: no edema; symmetrical with no gross deformities Skin: warm and dry Neurologic: Ambulates from chair to exam table without difficulty Psychological: alert and cooperative; normal mood and affect  Labs Reviewed  POCT URINALYSIS DIP (MANUAL ENTRY) - Abnormal; Notable for the following components:      Result Value   Blood, UA trace-intact (*)    All other components within normal limits  URINE CULTURE    ASSESSMENT & PLAN:  1. Dysuria   2. Urinary frequency   3. Urinary urgency     Meds ordered this encounter  Medications  . nitrofurantoin, macrocrystal-monohydrate, (MACROBID) 100 MG capsule    Sig: Take 1 capsule (100 mg total) by mouth 2 (two) times daily.    Dispense:  10 capsule    Refill:  0    Order Specific Question:   Supervising Provider    Answer:   Eustace Moore   Urine without obvious signs of infection today, however, will cover for UTI given recent surgery and symptoms Urine culture sent.  We will call you with abnormal results.   Push fluids and get plenty of rest.   Take antibiotic as directed and to completion Please call on-call surgeon today for further evaluation and managment Return here or go to ER if you have any new or worsening symptoms such as fever, worsening abdominal pain, nausea/vomiting, flank pain, etc...  Outlined signs and symptoms indicating need for more acute intervention. Patient verbalized understanding. After Visit Summary given.     [[id number]], PA-C 02/21/20 1502

## 2020-02-22 LAB — URINE CULTURE
Culture: NO GROWTH
Special Requests: NORMAL

## 2020-04-29 ENCOUNTER — Other Ambulatory Visit: Payer: Self-pay

## 2020-04-29 ENCOUNTER — Ambulatory Visit
Admission: EM | Admit: 2020-04-29 | Discharge: 2020-04-29 | Disposition: A | Discharge status: Home / Self Care | Payer: Medicaid Other

## 2020-04-29 DIAGNOSIS — T148XXA Other injury of unspecified body region, initial encounter: Secondary | ICD-10-CM

## 2020-04-29 MED ORDER — TETANUS-DIPHTH-ACELL PERTUSSIS 5-2.5-18.5 LF-MCG/0.5 IM SUSP
0.5000 mL | Freq: Once | INTRAMUSCULAR | Status: AC
  Administered 2020-04-29: 0.5 mL via INTRAMUSCULAR

## 2020-04-29 MED ORDER — SULFAMETHOXAZOLE-TRIMETHOPRIM 800-160 MG PO TABS: 1.0000 | ORAL_TABLET | Freq: Two times a day (BID) | ORAL | 0 refills | Status: AC

## 2020-04-29 NOTE — ED Provider Notes (Signed)
[facility]   [id number] 04/29/20 Arrival Time: 1113   CC: Foot Injury   SUBJECTIVE: History from: patient.  Nicole Pollard is a 39 y.o. female presented to the urgent care with a complaint that she stepped on a screw last night.  Reports she accidentally walk on a screw.  Has removed the screw.  Patient reported bleeding last night that is now resolved.  She is unsure of her tetanus immunization status.  She localized the pain to the right big toe.  She describes the pain as constant and achy.  She has tried OTC medications without relief.  Her symptoms are made worse with ROM.  She denies similar symptoms in the past.    ROS: As per HPI.  All other pertinent ROS negative.     Past Medical History:  Diagnosis Date  . Abnormal Pap smear of cervix    12/15/14 - ASCUS + HPV  . Anxiety 01/28/2015   Overview:  On zoloft 50mg   EPDS 7 on 03/18/15 Mood stable  Last Assessment & Plan:  Pt states that her mood is stable on zoloft 50mg   . Cellulitis of abdominal wall 09/01/2016  . Medical history non-contributory   . Short cervix affecting pregnancy 05/23/2015   Cerclage placed 04/03/15 at [facility]   . Vaginal Pap smear, abnormal    06/23/14 ASCUS + HPV   Past Surgical History:  Procedure Laterality Date  . ABDOMINOPLASTY  06/28/2016   [place]  . APPENDECTOMY    . CESAREAN SECTION    . TUBAL LIGATION     No Known Allergies No current facility-administered medications on file prior to encounter.   Current Outpatient Medications on File Prior to Encounter  Medication Sig Dispense Refill  . lamoTRIgine (LAMICTAL) 25 MG tablet Take by mouth.    . nitrofurantoin, macrocrystal-monohydrate, (MACROBID) 100 MG capsule Take 1 capsule (100 mg total) by mouth 2 (two) times daily. 10 capsule 0  . [DISCONTINUED] omeprazole (PRILOSEC) 20 MG capsule Take 1 capsule (20 mg total) by mouth daily. 30 capsule 0  . [DISCONTINUED] ranitidine (ZANTAC) 150 MG tablet Take 1 tablet (150 mg total)  by mouth 2 (two) times daily. 60 tablet 0   Social History   Socioeconomic History  . Marital status: Married    Spouse name: Not on file  . Number of children: Not on file  . Years of education: Not on file  . Highest education level: Not on file  Occupational History  . Not on file  Tobacco Use  . Smoking status: Never Smoker  . Smokeless tobacco: Never Used  Substance and Sexual Activity  . Alcohol use: Yes    Comment: Social  . Drug use: No  . Sexual activity: Yes    Birth control/protection: Surgical  Other Topics Concern  . Not on file  Social History Narrative  . Not on file   Social Determinants of Health   Financial Resource Strain:   . Difficulty of Paying Living Expenses: Not on file  Food Insecurity:   . Worried About 06/25/14 in the Last Year: Not on file  . Ran Out of Food in the Last Year: Not on file  Transportation Needs:   . Lack of Transportation (Medical): Not on file  . Lack of Transportation (Non-Medical): Not on file  Physical Activity:   . Days of Exercise per Week: Not on file  . Minutes of Exercise per Session: Not on file  Stress:   . Feeling  of Stress : Not on file  Social Connections:   . Frequency of Communication with Friends and Family: Not on file  . Frequency of Social Gatherings with Friends and Family: Not on file  . Attends Religious Services: Not on file  . Active Member of Clubs or Organizations: Not on file  . Attends Banker Meetings: Not on file  . Marital Status: Not on file  Intimate Partner Violence:   . Fear of Current or Ex-Partner: Not on file  . Emotionally Abused: Not on file  . Physically Abused: Not on file  . Sexually Abused: Not on file   Family History  Problem Relation Age of Onset  . Alcohol abuse Mother   . Drug abuse Father   . Hypertension Father   . Anxiety disorder Maternal Aunt   . Cancer Maternal Grandmother        Cervical    OBJECTIVE:  Vitals:   04/29/20 1124    BP: 126/81  Pulse: 84  Resp: 20  Temp: 98.5 F (36.9 C)  SpO2: 98%     Physical Exam Vitals and nursing note reviewed.  Constitutional:      General: She is not in acute distress.    Appearance: Normal appearance. She is normal weight. She is not ill-appearing, toxic-appearing or diaphoretic.  HENT:     Head: Normocephalic.  Cardiovascular:     Rate and Rhythm: Normal rate and regular rhythm.     Pulses: Normal pulses.     Heart sounds: Normal heart sounds. No murmur heard.  No friction rub. No gallop.   Pulmonary:     Effort: Pulmonary effort is normal. No respiratory distress.     Breath sounds: Normal breath sounds. No stridor. No wheezing, rhonchi or rales.  Chest:     Chest wall: No tenderness.  Musculoskeletal:        General: Normal range of motion.     Left foot: Normal.     Comments: Puncture wound present on the right big toe.  Normal range of motion.  Neurovascular status intact.  Neurological:     Mental Status: She is alert and oriented to person, place, and time.     LABS:  No results found for this or any previous visit (from the past 24 hour(s)).   ASSESSMENT & PLAN:  1. Puncture wound     Meds ordered this encounter  Medications  . Tdap (BOOSTRIX) injection 0.5 mL  . sulfamethoxazole-trimethoprim (BACTRIM DS) 800-160 MG tablet    Sig: Take 1 tablet by mouth 2 (two) times daily for 7 days.    Dispense:  14 tablet    Refill:  0    Discharge Instructions  Keep wound area clean with water and mild soap.  May apply thin layer of Neosporin. Bactrim DS was prescribed.  Take as directed Tetanus immunization was given Follow-up with PCP Return to to ED for worsening of symptoms  Reviewed expectations re: course of current medical issues. Questions answered. Outlined signs and symptoms indicating need for more acute intervention. Patient verbalized understanding. After Visit Summary given.         Durward Parcel, FNP 04/29/20  1142

## 2020-04-29 NOTE — Discharge Instructions (Addendum)
Keep wound area clean with water and mild soap.  May apply thin layer of Neosporin. Bactrim DS was prescribed.  Take as directed Tetanus immunization was given Follow-up with PCP Return to to ED for worsening of symptoms

## 2020-04-29 NOTE — ED Triage Notes (Signed)
Pt stepped on screw last night, needs tetanus updated

## 2020-06-16 ENCOUNTER — Encounter: Payer: Self-pay | Admitting: Internal Medicine

## 2020-06-16 ENCOUNTER — Ambulatory Visit (INDEPENDENT_AMBULATORY_CARE_PROVIDER_SITE_OTHER): Payer: Medicaid Other | Admitting: Internal Medicine

## 2020-06-16 VITALS — BP 125/81 | HR 84 | Temp 98.5°F | Resp 16 | Ht 63.0 in | Wt 159.0 lb

## 2020-06-16 DIAGNOSIS — M6208 Separation of muscle (nontraumatic), other site: Secondary | ICD-10-CM

## 2020-06-16 DIAGNOSIS — L659 Nonscarring hair loss, unspecified: Secondary | ICD-10-CM | POA: Insufficient documentation

## 2020-06-16 DIAGNOSIS — K5909 Other constipation: Secondary | ICD-10-CM | POA: Diagnosis not present

## 2020-06-16 DIAGNOSIS — R0683 Snoring: Secondary | ICD-10-CM | POA: Insufficient documentation

## 2020-06-16 DIAGNOSIS — Z7689 Persons encountering health services in other specified circumstances: Secondary | ICD-10-CM

## 2020-06-16 DIAGNOSIS — Z23 Encounter for immunization: Secondary | ICD-10-CM

## 2020-06-16 NOTE — Assessment & Plan Note (Signed)
Care established Previous chart reviewed History and medications reviewed with the patient 

## 2020-06-16 NOTE — Patient Instructions (Addendum)
Please keep a food diary to help find out the particular kind of food intolerance.  Try to use lactose free dairy products for any difference.  Okay to take GasX/Simethicone as needed for bloating sensation.  Please increase fluid intake up to at least 1.5-2 l per day.  Please get blood tests done in the morning before breakfast.  Please take Biotin and Zinc supplements for better hair and skin care.

## 2020-06-16 NOTE — Assessment & Plan Note (Addendum)
Advised to increase fluid intake Increase green vegetables and fruits in diet If unresolving, Bacid PRN  Also c/o bloating, Simethicone PRN Advised to keep the food diary Trial of lactose free dairy products

## 2020-06-16 NOTE — Assessment & Plan Note (Signed)
S/p cosmetic surgery Well-healed scar

## 2020-06-16 NOTE — Assessment & Plan Note (Signed)
Could be multifactorial, different shampoo vs stress vs thyroid (?) vs nutritional deficiency No signs of fungal infection for now Advised to take Biotin and Zinc Check TSH and CMP

## 2020-06-16 NOTE — Assessment & Plan Note (Signed)

## 2020-06-16 NOTE — Progress Notes (Signed)
New Patient Office Visit  Subjective:  Patient ID: Nicole Pollard, female    DOB: [birth year]  Age: 39 y.o. MRN: [id number]  CC:  Chief Complaint  Patient presents with  . New Patient (Initial Visit)    new pt hasnt had a pcp in ahwile has had lots of hair loss also skipped period last month but her tubes have been tied she has been having lots of swelling and bloating in her stomach about everyday did have a stomach surgery in the past and she is wondering why she snores alot and doesnt feel rested     HPI Nicole Pollard is a 39 year old female with PMH of abnormal PAP smear in the past (ASCUS), anxiety and diastasis recti s/p surgery who presents for establishing care.  She c/o bloating sensation after eating and has chronic constipation. She has noticed some benefit with green vegetables, but has not tried any medications. She denies any heartburn, melena, hematochezia, dysuria, hematuria or urinary hesitancy.  She also c/o hair loss, but not in particular patches, denies any dandruff. She has been trying different shampoo to help with the hair loss. Denies any nail problem. Has been gaining weight since last 1 year, but unable to quantify.  She states that her partner has noticed that she snores everyday and has noticed her gasping for air at least once. Her father has h/o OSA.  She had last PAP smear in 05/2019, which was normal according to the patient.  She has had both doses of COVID vaccines. She received flu vaccine in the office today.  Past Medical History:  Diagnosis Date  . Abnormal Pap smear of cervix    12/15/14 - ASCUS + HPV  . Anxiety 01/28/2015   Overview:  On zoloft 52m  EPDS 7 on 03/18/15 Mood stable  Last Assessment & Plan:  Pt states that her mood is stable on zoloft 576m . Anxiety 01/28/2015   Overview:  On zoloft 5024mEPDS 7 on 03/18/15 Mood stable  Last Assessment & Plan:  Pt states that her mood is stable on zoloft 74m16mormatting of this note might be  different from the original. On zoloft 74mg32mDS 7 on 03/18/15 Mood stable  Overview:  Overview:  On zoloft 74mg 38mS 7 on 03/18/15 Mood stable  Last Assessment & Plan:  Pt states that her mood is stable on zoloft 74mg  46m Assessmen  . Cellulitis of abdominal wall 09/01/2016  . Medical history non-contributory   . Short cervix affecting pregnancy 05/23/2015   [facility]    06/23/14 ASCUS + HPV    Past Surgical History:  Procedure Laterality Date  . ABDOMINOPLASTY  06/28/2016   [place], [place]  . APPENDECTOMY    . CESAREAN SECTION    . TUBAL LIGATION      Family History  Problem Relation Age of Onset  . Alcohol abuse Mother   . Drug abuse Father   . Hypertension Father   . Anxiety disorder Maternal Aunt   . Cancer Maternal Grandmother        Cervical    Social History   Socioeconomic History  . Marital status: Married    Spouse name: Not on file  . Number of children: Not on file  . Years of education: Not on file  . Highest education level: Not on file  Occupational History  . Not on file  Tobacco Use  .  Smoking status: Never Smoker  . Smokeless tobacco: Never Used  Substance and Sexual Activity  . Alcohol use: Yes    Comment: Social  . Drug use: No  . Sexual activity: Yes    Birth control/protection: Surgical  Other Topics Concern  . Not on file  Social History Narrative  . Not on file   Social Determinants of Health   Financial Resource Strain:   . Difficulty of Paying Living Expenses: Not on file  Food Insecurity:   . Worried About Charity fundraiser in the Last Year: Not on file  . Ran Out of Food in the Last Year: Not on file  Transportation Needs:   . Lack of Transportation (Medical): Not on file  . Lack of Transportation (Non-Medical): Not on file  Physical Activity:   . Days of Exercise per Week: Not on file  . Minutes of Exercise per Session: Not on file  Stress:   . Feeling of Stress :  Not on file  Social Connections:   . Frequency of Communication with Friends and Family: Not on file  . Frequency of Social Gatherings with Friends and Family: Not on file  . Attends Religious Services: Not on file  . Active Member of Clubs or Organizations: Not on file  . Attends Archivist Meetings: Not on file  . Marital Status: Not on file  Intimate Partner Violence:   . Fear of Current or Ex-Partner: Not on file  . Emotionally Abused: Not on file  . Physically Abused: Not on file  . Sexually Abused: Not on file    ROS Review of Systems  Constitutional: Negative for chills and fever.  HENT: Negative for congestion, sinus pressure, sinus pain and sore throat.   Eyes: Negative for pain and discharge.  Respiratory: Negative for cough and shortness of breath.   Cardiovascular: Negative for chest pain and palpitations.  Gastrointestinal: Positive for constipation. Negative for abdominal pain, diarrhea, nausea and vomiting.  Endocrine: Negative for polydipsia and polyuria.  Genitourinary: Negative for dysuria and hematuria.  Musculoskeletal: Negative for neck pain and neck stiffness.  Skin: Negative for rash.  Neurological: Negative for dizziness and weakness.  Psychiatric/Behavioral: Negative for agitation and behavioral problems.    Objective:   Today's Vitals: BP 125/81 (BP Location: Right Arm, Patient Position: Sitting, Cuff Size: Normal)   Pulse 84   Temp 98.5 F (36.9 C) (Oral)   Resp 16   Ht '5\' 3"'  (1.6 m)   Wt 159 lb (72.1 kg)   SpO2 99%   BMI 28.17 kg/m   Physical Exam Vitals reviewed.  Constitutional:      General: She is not in acute distress.    Appearance: She is not diaphoretic.  HENT:     Head: Normocephalic and atraumatic.     Nose: Nose normal.     Mouth/Throat:     Mouth: Mucous membranes are moist.  Eyes:     General: No scleral icterus.    Extraocular Movements: Extraocular movements intact.     Pupils: Pupils are equal, round, and  reactive to light.  Cardiovascular:     Rate and Rhythm: Normal rate and regular rhythm.     Pulses: Normal pulses.     Heart sounds: Normal heart sounds. No murmur heard.   Pulmonary:     Breath sounds: Normal breath sounds. No wheezing or rales.  Abdominal:     Palpations: Abdomen is soft.     Tenderness: There is no abdominal tenderness.  Musculoskeletal:     Cervical back: Neck supple. No tenderness.     Right lower leg: No edema.     Left lower leg: No edema.  Skin:    General: Skin is warm.     Findings: No rash.  Neurological:     General: No focal deficit present.     Mental Status: She is alert and oriented to person, place, and time.     Sensory: No sensory deficit.     Motor: No weakness.  Psychiatric:        Mood and Affect: Mood normal.        Behavior: Behavior normal.      Assessment & Plan:   Problem List Items Addressed This Visit      Encounter to establish care - Primary   Care established Previous chart reviewed History and medications reviewed with the patient     Relevant Orders  CBC with Differential  CMP14+EGFR  Hepatitis C Antibody  Lipid Profile  TSH + free T4  Vitamin D (25 hydroxy)    Digestive   Chronic constipation    Advised to increase fluid intake Increase green vegetables and fruits in diet If unresolving, Bacid PRN  Also c/o bloating, Simethicone PRN Advised to keep the food diary Trial of lactose free dairy products        Musculoskeletal and Integument   Diastasis recti    S/p cosmetic surgery Well-healed scar        Other   Hair loss    Could be multifactorial, different shampoo vs stress vs thyroid (?) vs nutritional deficiency No signs of fungal infection for now Advised to take Biotin and Zinc Check TSH and CMP      Need for immunization against influenza    Patient was educated on the recommendation for flu vaccine. After obtaining informed consent, the vaccine was administered no adverse effects  noted at time of administration. Patient provided with education on arm soreness and use of tylenol or ibuprofen (if safe) for this. Encourage to use the arm vaccine was given in to help reduce the soreness. Patient educated on the signs of a reaction to the vaccine and advised to contact the office should these occur.      Relevant Orders   Flu Vaccine QUAD 36+ mos IM (Completed)     Snoring With likely apnea episode Referral for sleep study  Outpatient Encounter Medications as of 06/16/2020  Medication Sig  . [DISCONTINUED] lamoTRIgine (LAMICTAL) 25 MG tablet Take by mouth. (Patient not taking: Reported on 06/16/2020)  . [DISCONTINUED] nitrofurantoin, macrocrystal-monohydrate, (MACROBID) 100 MG capsule Take 1 capsule (100 mg total) by mouth 2 (two) times daily. (Patient not taking: Reported on 06/16/2020)  . [DISCONTINUED] omeprazole (PRILOSEC) 20 MG capsule Take 1 capsule (20 mg total) by mouth daily.  . [DISCONTINUED] ranitidine (ZANTAC) 150 MG tablet Take 1 tablet (150 mg total) by mouth 2 (two) times daily.   No facility-administered encounter medications on file as of 06/16/2020.    Follow-up: Return in about 6 months (around 12/14/2020).   Lindell Spar, MD

## 2020-06-16 NOTE — Assessment & Plan Note (Signed)
With likely apnea episode Referral for sleep study

## 2020-06-19 LAB — CMP14+EGFR
ALT: 20 IU/L (ref 0–32)
AST: 19 IU/L (ref 0–40)
Albumin/Globulin Ratio: 1.8 (ref 1.2–2.2)
Albumin: 4.4 g/dL (ref 3.8–4.8)
Alkaline Phosphatase: 71 IU/L (ref 44–121)
BUN/Creatinine Ratio: 12 (ref 9–23)
BUN: 8 mg/dL (ref 6–20)
Bilirubin Total: 0.4 mg/dL (ref 0.0–1.2)
CO2: 23 mmol/L (ref 20–29)
Calcium: 9.4 mg/dL (ref 8.7–10.2)
Chloride: 101 mmol/L (ref 96–106)
Creatinine, Ser: 0.68 mg/dL (ref 0.57–1.00)
GFR calc Af Amer: 129 mL/min/{1.73_m2} (ref >59)
GFR calc non Af Amer: 112 mL/min/{1.73_m2} (ref >59)
Globulin, Total: 2.4 g/dL (ref 1.5–4.5)
Glucose: 81 mg/dL (ref 65–99)
Potassium: 4.6 mmol/L (ref 3.5–5.2)
Sodium: 138 mmol/L (ref 134–144)
Total Protein: 6.8 g/dL (ref 6.0–8.5)

## 2020-06-19 LAB — CBC WITH DIFFERENTIAL/PLATELET
Basophils Absolute: 0.1 10*3/uL (ref 0.0–0.2)
Basos: 1 %
EOS (ABSOLUTE): 0.1 10*3/uL (ref 0.0–0.4)
Eos: 2 %
Hematocrit: 41.6 % (ref 34.0–46.6)
Hemoglobin: 14.4 g/dL (ref 11.1–15.9)
Immature Grans (Abs): 0 10*3/uL (ref 0.0–0.1)
Immature Granulocytes: 0 %
Lymphocytes Absolute: 1.5 10*3/uL (ref 0.7–3.1)
Lymphs: 21 %
MCH: 32.1 pg (ref 26.6–33.0)
MCHC: 34.6 g/dL (ref 31.5–35.7)
MCV: 93 fL (ref 79–97)
Monocytes Absolute: 1 10*3/uL — ABNORMAL HIGH (ref 0.1–0.9)
Monocytes: 14 %
Neutrophils Absolute: 4.5 10*3/uL (ref 1.4–7.0)
Neutrophils: 62 %
Platelets: 248 10*3/uL (ref 150–450)
RBC: 4.49 x10E6/uL (ref 3.77–5.28)
RDW: 11.9 % (ref 11.7–15.4)
WBC: 7.3 10*3/uL (ref 3.4–10.8)

## 2020-06-19 LAB — HEPATITIS C ANTIBODY: Hep C Virus Ab: <0.1 s/co ratio (ref 0.0–0.9)

## 2020-06-19 LAB — LIPID PANEL
Chol/HDL Ratio: 2.4 ratio (ref 0.0–4.4)
Cholesterol, Total: 212 mg/dL — ABNORMAL HIGH (ref 100–199)
HDL: 87 mg/dL (ref >39)
LDL Chol Calc (NIH): 105 mg/dL — ABNORMAL HIGH (ref 0–99)
Triglycerides: 116 mg/dL (ref 0–149)
VLDL Cholesterol Cal: 20 mg/dL (ref 5–40)

## 2020-06-19 LAB — TSH+FREE T4
Free T4: 0.89 ng/dL (ref 0.82–1.77)
TSH: 3.31 u[IU]/mL (ref 0.450–4.500)

## 2020-06-19 LAB — VITAMIN D 25 HYDROXY (VIT D DEFICIENCY, FRACTURES): Vit D, 25-Hydroxy: 19.1 ng/mL — ABNORMAL LOW (ref 30.0–100.0)

## 2020-07-02 ENCOUNTER — Telehealth: Payer: Self-pay

## 2020-07-02 NOTE — Telephone Encounter (Signed)
PT is calling stating that Dr Allena Katz said he would refer for a sleep study.  Please advise.

## 2020-07-03 ENCOUNTER — Other Ambulatory Visit: Payer: Self-pay | Admitting: Internal Medicine

## 2020-07-03 DIAGNOSIS — R0683 Snoring: Secondary | ICD-10-CM

## 2020-07-03 NOTE — Telephone Encounter (Signed)
This referral has been placed pt should hear from them within two weeks

## 2020-07-03 NOTE — Telephone Encounter (Signed)
There was nothing in the AVS about a sleep study would you like to refer for sleep study or does she need another visit ?

## 2020-07-03 NOTE — Telephone Encounter (Signed)
Referral made for sleep study. I had talked to her about it during that visit. Thanks.

## 2020-09-01 ENCOUNTER — Encounter: Payer: Self-pay | Admitting: Pulmonary Disease

## 2020-09-01 ENCOUNTER — Other Ambulatory Visit: Payer: Self-pay

## 2020-09-01 ENCOUNTER — Telehealth: Payer: Self-pay | Admitting: Pulmonary Disease

## 2020-09-01 ENCOUNTER — Ambulatory Visit (INDEPENDENT_AMBULATORY_CARE_PROVIDER_SITE_OTHER): Payer: Medicaid Other | Admitting: Pulmonary Disease

## 2020-09-01 VITALS — BP 112/70 | HR 89 | Temp 97.8°F | Ht 63.5 in | Wt 162.2 lb

## 2020-09-01 DIAGNOSIS — R0683 Snoring: Secondary | ICD-10-CM

## 2020-09-01 NOTE — Telephone Encounter (Signed)
done

## 2020-09-01 NOTE — Telephone Encounter (Signed)
Order was placed for home sleep study.  Per Almyra Free pt will need to have an inlab study due to insurance.  Will route to triage to have inlab study order placed.

## 2020-09-01 NOTE — Progress Notes (Signed)
[facility]  Chief Complaint  Patient presents with  . Consult    Sleep Consult     Constitutional:  BP 112/70 (BP Location: Left Arm, Cuff Size: Normal)   Pulse 89   Temp 97.8 F (36.6 C) (Other (Comment)) Comment (Src): wrist  Ht 5' 3.5" (1.613 m)   Wt 162 lb 3.2 oz (73.6 kg)   SpO2 98%   BMI 28.28 kg/m   Past Medical History:  Anxiety  Past Surgical History:  She  has a past surgical history that includes Cesarean section; Appendectomy; Abdominoplasty (06/28/2016); and Tubal ligation.  Brief Summary:  Nicole Pollard is a 40 y.o. female with snoring.      Subjective:   Her partner has been concerned about her snoring.  She will wake up with a jolt and gets fluttery feelings in her chest.  She is a restless sleeper.  She has trouble staying focused at times during the day.  Her father and aunt have sleep apnea.  She goes to sleep between 12 and 1 am.  She falls asleep in 30 minutes.  She wakes up some times to use the bathroom.  She gets out of bed at 10 am.  She feels tired in the morning.  She denies morning headache.  She does not use anything to help her fall sleep.  She drinks coffee during the day.  She denies sleep walking, sleep talking, bruxism, or nightmares.  There is no history of restless legs.  She denies sleep hallucinations, sleep paralysis, or cataplexy.  The Epworth score is 4 out of 24.    Physical Exam:   Appearance - well kempt   ENMT - no sinus tenderness, no oral exudate, no LAN, Mallampati 4 airway, no stridor, scalloped tongue  Respiratory - equal breath sounds bilaterally, no wheezing or rales  CV - s1s2 regular rate and rhythm, no murmurs  Ext - no clubbing, no edema  Skin - no rashes  Psych - normal mood and affect   Sleep Tests:    Social History:  She  reports that she has never smoked. She has never used smokeless tobacco. She reports current alcohol use. She reports that she  does not use drugs.  Family History:  Her family history includes Alcohol abuse in her mother; Anxiety disorder in her maternal aunt; Cancer in her maternal grandmother; Drug abuse in her father; Hypertension in her father.    Discussion:  She has snoring, sleep disruption, apnea, and daytime sleepiness.  She has history of mood disorder with anxiety.  She has family history of sleep apnea.  I am concerned she could have obstructive sleep apnea.  Assessment/Plan:   Snoring with excessive daytime sleepiness. - will need to arrange for a home sleep study  Obesity. - discussed how weight can impact sleep and risk for sleep disordered breathing - discussed options to assist with weight loss: combination of diet modification, cardiovascular and strength training exercises  Cardiovascular risk. - had an extensive discussion regarding the adverse health consequences related to untreated sleep disordered breathing - specifically discussed the risks for hypertension, coronary artery disease, cardiac dysrhythmias, cerebrovascular disease, and diabetes - lifestyle modification discussed  Safe driving practices. - discussed how sleep disruption can increase risk of accidents, particularly when driving - safe driving practices were discussed  Therapies for obstructive sleep apnea. - if the sleep study shows significant sleep apnea, then various therapies for treatment were reviewed: CPAP, oral appliance, and surgical  interventions  Time Spent Involved in Patient Care on Day of Examination:  31 minutes  Follow up:  Patient Instructions  Will arrange for home sleep study Will call to arrange for follow up after sleep study reviewed    Medication List:   Allergies as of 09/01/2020   No Known Allergies     Medication List       Accurate as of September 01, 2020 10:11 AM. If you have any questions, ask your nurse or doctor.        cholecalciferol 25 MCG (1000 UNIT) tablet Commonly  known as: VITAMIN D3 Take 1,000 Units by mouth daily.       Signature:  Coralyn Helling, MD [facility] Pulmonary/Critical Care Pager - [phone] 09/01/2020, 10:11 AM

## 2020-09-01 NOTE — Patient Instructions (Signed)
Will arrange for home sleep study Will call to arrange for follow up after sleep study reviewed  

## 2020-09-15 ENCOUNTER — Other Ambulatory Visit: Payer: Self-pay

## 2020-09-15 ENCOUNTER — Ambulatory Visit: Payer: Medicaid Other | Attending: Pulmonary Disease | Admitting: Pulmonary Disease

## 2020-09-15 DIAGNOSIS — R0683 Snoring: Secondary | ICD-10-CM

## 2020-09-17 ENCOUNTER — Telehealth: Payer: Self-pay | Admitting: Pulmonary Disease

## 2020-09-17 NOTE — Telephone Encounter (Signed)
PSG 09/15/20 >> AHI 0.7, SpO2 low 93%   Please inform her that her sleep study did not show obstructive sleep apnea.  Please arrange for ROV with me or NP if she is still having difficulties with her sleep.

## 2020-09-17 NOTE — Procedures (Signed)
    Patient Name: Nicole Pollard, Cocuzza Date: 09/15/2020 Gender: Female D.O.B: 04/23/1981 Age (years): 39 Referring Provider: Coralyn Helling MD, ABSM Height (inches): 65 Interpreting Physician: Coralyn Helling MD, ABSM Weight (lbs): 160 RPSGT: Alfonso Ellis BMI: 27 MRN: [id number] Neck Size: 15.50  CLINICAL INFORMATION Sleep Study Type: NPSG  Indication for sleep study: snoring, sleep disruption, and daytime sleepiness.  Epworth Sleepiness Score: 5  SLEEP STUDY TECHNIQUE As per the AASM Manual for the Scoring of Sleep and Associated Events v2.3 (April 2016) with a hypopnea requiring 4% desaturations.  The channels recorded and monitored were frontal, central and occipital EEG, electrooculogram (EOG), submentalis EMG (chin), nasal and oral airflow, thoracic and abdominal wall motion, anterior tibialis EMG, snore microphone, electrocardiogram, and pulse oximetry.  MEDICATIONS Medications self-administered by patient taken the night of the study : N/A  SLEEP ARCHITECTURE The study was initiated at 10:57:27 PM and ended at 5:27:45 AM.  Sleep onset time was 40.3 minutes and the sleep efficiency was 86.1%. The total sleep time was 336 minutes.  Stage REM latency was 146.5 minutes.  The patient spent 2.23% of the night in stage N1 sleep, 56.70% in stage N2 sleep, 23.51% in stage N3 and 17.6% in REM.  Alpha intrusion was absent.  Supine sleep was 47.24%.  RESPIRATORY PARAMETERS The overall apnea/hypopnea index (AHI) was 0.7 per hour. There were 3 total apneas, including 0 obstructive, 3 central and 0 mixed apneas. There were 1 hypopneas and 0 RERAs.  The AHI during Stage REM sleep was 1.0 per hour.  AHI while supine was 1.5 per hour.  The mean oxygen saturation was 97.14%. The minimum SpO2 during sleep was 93.00%.  soft snoring was noted during this study.  CARDIAC DATA The 2 lead EKG demonstrated sinus rhythm. The mean heart rate was 73.86 beats per minute. Other EKG  findings include: PVCs.  LEG MOVEMENT DATA The total PLMS were 0 with a resulting PLMS index of 0.00. Associated arousal with leg movement index was 0.0 .  IMPRESSIONS - No significant obstructive sleep apnea occurred during this study (AHI = 0.7/h). - No significant central sleep apnea occurred during this study (CAI = 0.5/h). - The patient had minimal or no oxygen desaturation during the study (Min O2 = 93.00%) - The patient snored with soft snoring volume. - Clinically significant periodic limb movements did not occur during sleep. No significant associated arousals.  DIAGNOSIS - Snoring.  RECOMMENDATIONS - Avoid alcohol, sedatives and other CNS depressants that may worsen sleep apnea and disrupt normal sleep architecture. - Sleep hygiene should be reviewed to assess factors that may improve sleep quality. - Weight management and regular exercise should be initiated or continued if appropriate.  [Electronically signed] 09/17/2020 02:18 PM  Coralyn Helling MD, ABSM Diplomate, American Board of Sleep Medicine   NPI: [id number]

## 2020-09-18 NOTE — Telephone Encounter (Signed)
Called and went over Sleep study results per Dr Craige Cotta with patient. All questions answered and patient expressed full understanding of results. Patient wanting to schedule visit due to wanting to go over in more detail of what results say and still having difficulty sleeping. Scheduled televisit with NP due to no openings at [place] office currently and patient wanting to go over results in more detail sooner. Patient agreeable to televisit. Scheduled Televisit for Tuesday 09/22/20 at 2:30pm. Patient agreeable to time and date. Will send to Dr Craige Cotta as Lorain Childes.

## 2020-09-22 ENCOUNTER — Ambulatory Visit (INDEPENDENT_AMBULATORY_CARE_PROVIDER_SITE_OTHER): Payer: Medicaid Other | Admitting: Primary Care

## 2020-09-22 ENCOUNTER — Encounter: Payer: Self-pay | Admitting: Primary Care

## 2020-09-22 ENCOUNTER — Other Ambulatory Visit: Payer: Self-pay

## 2020-09-22 DIAGNOSIS — R0683 Snoring: Secondary | ICD-10-CM | POA: Diagnosis not present

## 2020-09-22 DIAGNOSIS — I493 Ventricular premature depolarization: Secondary | ICD-10-CM

## 2020-09-22 NOTE — Progress Notes (Signed)
Virtual Visit via Telephone Note  I connected with Nicole Pollard on 09/22/20 at  2:30 PM EST by telephone and verified that I am speaking with the correct person using two identifiers.  Location: Patient: Home Provider: Office   I discussed the limitations, risks, security and privacy concerns of performing an evaluation and management service by telephone and the availability of in person appointments. I also discussed with the patient that there may be a patient responsible charge related to this service. The patient expressed understanding and agreed to proceed.   History of Present Illness: 40 year old female, never smoked.  Past medical history significant for snoring, anxiety.  Patient of Dr. Craige Cotta, seen for initial consult on 09/01/2020.  PSG 09/15/2020 showed no evidence of obstructive sleep apnea with average AHI 0.7/HR.  09/22/2020 Patient contacted today to review sleep study results. HST on 09/15/20 showed on evidence of obstructive sleep apnea; AHI 0.7/hr with SpO2 low 93%. She had mild-moderate snoring in supine position. Frequent PVCs. She had some difficulty initiating sleep during study but reports this was due to nature of the test. She does not have a significantly hard time falling asleep at home. She was mostly concerned about her snoring. Family hx of sleep apnea.    Observations/Objective:  - Able to speak in full sentences; no overt shortness of breath or wheezing   Assessment and Plan:  Snoring: - PSG 09/15/2020 showed no evidence of obstructive sleep apnea with average AHI 0.7/HR. She had mild-moderate snoring in supine position. Frequent PVCs.  - Recommend side sleeping position or wedge pillow to help with snoring. Advised she limit blue light before bedtime, keep a regular sleep schedule and get some form of daily exercise - Encourage patient maintain healthy weight  Frequent PVCs: - Observed during sleep study - Refer to cardiology   Follow Up Instructions:   -  Follow up as needed if sleep quality does not improve   I discussed the assessment and treatment plan with the patient. The patient was provided an opportunity to ask questions and all were answered. The patient agreed with the plan and demonstrated an understanding of the instructions.   The patient was advised to call back or seek an in-person evaluation if the symptoms worsen or if the condition fails to improve as anticipated.  I provided 22 minutes of non-face-to-face time during this encounter.   Glenford Bayley, NP

## 2020-09-22 NOTE — Progress Notes (Signed)
Reviewed and agree with assessment/plan.   Coralyn Helling, MD [facility] Pulmonary/Critical Care 09/22/2020, 3:20 PM Pager:  [phone]

## 2020-09-22 NOTE — Assessment & Plan Note (Signed)
-   Observed during sleep study. Refer to cardiology

## 2020-09-22 NOTE — Patient Instructions (Signed)
Nice speaking with you today  Home sleep no evidence of obstructive sleep apnea; on average you had less than 1 event an hour which is normal.  Your baseline oxygen was 98%, lowest was 93%.    You did have mild to moderate snoring while sleeping on your back.  You also had frequent PVCs which will refer to cardiology to further work-up.  Sleep hygiene:  Encourage some form of daily physical exercise Recommend cool-dark sleep environment Limit screen time/bluelight prior to bed If still having issues with sleep please return   Referral: Cardiology re: PVCs  Follow-up: As needed if sleep does not improve

## 2020-09-22 NOTE — Assessment & Plan Note (Signed)
-   PSG 09/15/2020 showed no evidence of obstructive sleep apnea with average AHI 0.7/HR. She had mild-moderate snoring in supine position. Frequent PVCs.  - Recommend side sleeping position or wedge pillow to help with snoring. Advised she limit blue light before bedtime, keep a regular sleep schedule and get some form of daily exercise - Encourage patient maintain healthy weight - Follow up as needed if sleep quality does not improve

## 2020-11-13 ENCOUNTER — Ambulatory Visit (INDEPENDENT_AMBULATORY_CARE_PROVIDER_SITE_OTHER): Payer: Medicaid Other

## 2020-11-13 ENCOUNTER — Other Ambulatory Visit: Payer: Self-pay

## 2020-11-13 ENCOUNTER — Other Ambulatory Visit: Payer: Self-pay | Admitting: Internal Medicine

## 2020-11-13 ENCOUNTER — Encounter: Payer: Self-pay | Admitting: Internal Medicine

## 2020-11-13 ENCOUNTER — Ambulatory Visit (INDEPENDENT_AMBULATORY_CARE_PROVIDER_SITE_OTHER): Payer: Medicaid Other | Admitting: Internal Medicine

## 2020-11-13 VITALS — BP 130/72 | HR 102 | Ht 64.0 in | Wt 167.0 lb

## 2020-11-13 DIAGNOSIS — I493 Ventricular premature depolarization: Secondary | ICD-10-CM

## 2020-11-13 DIAGNOSIS — R0602 Shortness of breath: Secondary | ICD-10-CM

## 2020-11-13 DIAGNOSIS — R002 Palpitations: Secondary | ICD-10-CM

## 2020-11-13 NOTE — Patient Instructions (Signed)
Medication Instructions:  Your physician recommends that you continue on your current medications as directed. Please refer to the Current Medication list given to you today.  *If you need a refill on your cardiac medications before your next appointment, please call your pharmacy*   Lab Work: None If you have labs (blood work) drawn today and your tests are completely normal, you will receive your results only by: Marland Kitchen MyChart Message (if you have MyChart) OR . A paper copy in the mail If you have any lab test that is abnormal or we need to change your treatment, we will call you to review the results.   Testing/Procedures: Your physician has requested that you have an echocardiogram. Echocardiography is a painless test that uses sound waves to create images of your heart. It provides your doctor with information about the size and shape of your heart and how well your heart's chambers and valves are working. This procedure takes approximately one hour. There are no restrictions for this procedure.     Follow-Up: At [facility], you and your health needs are our priority.  As part of our continuing mission to provide you with exceptional heart care, we have created designated Provider Care Teams.  These Care Teams include your primary Cardiologist (physician) and Advanced Practice Providers (APPs -  Physician Assistants and Nurse Practitioners) who all work together to provide you with the care you need, when you need it.  We recommend signing up for the patient portal called "MyChart".  Sign up information is provided on this After Visit Summary.  MyChart is used to connect with patients for Virtual Visits (Telemedicine).  Patients are able to view lab/test results, encounter notes, upcoming appointments, etc.  Non-urgent messages can be sent to your provider as well.   To learn more about what you can do with MyChart, go to [email or web address].    Your next appointment:   Follow up  to be determined  Other Instructions ZIO AT  . Call [employer] with any questions during wear 24/7: [phone] . Zio patch should be worn for 14 days unless otherwise instructed . The [employer] [facility]) will call you 24-48 hrs. after [employer] is applied, be sure to answer the phone from a 224 area code.  They may call during wear with important information regarding your heart, so answer any calls from [employer] . Do not shower for 24 hours after Zio is applied (sponge bath is fine, just keep the patch dry) . After that, when showering avoid direct shower stream of water onto Zio patch, pat dry around the patch . Avoid excessive sweating . Push the button when you are feeling any cardiac symptoms and record them in the logbook or on the Zio app . Refer to the removal date listed on the front of the symptom logbook and remove Zio patch according to the instructions in the back of the logbook . Place Zio patch inside transmitter (sticky side facing up, button facing down) and put both the gateway and symptom logbook in the prepaid mailing envelope included in the back of the transmitter.  Place inside mailbox or any USPS mailbox   Echocardiogram An echocardiogram is a test that uses sound waves (ultrasound) to produce images of the heart. Images from an echocardiogram can provide important information about:  Heart size and shape.  The size and thickness and movement of your heart's walls.  Heart muscle function and strength.  Heart valve function or if you have stenosis. Stenosis  is when the heart valves are too narrow.  If blood is flowing backward through the heart valves (regurgitation).  A tumor or infectious growth around the heart valves.  Areas of heart muscle that are not working well because of poor blood flow or injury from a heart attack.  Aneurysm detection. An aneurysm is a weak or damaged part of an artery wall. The wall bulges out from the normal force of blood pumping  through the body. Tell a health care provider about:  Any allergies you have.  All medicines you are taking, including vitamins, herbs, eye drops, creams, and over-the-counter medicines.  Any blood disorders you have.  Any surgeries you have had.  Any medical conditions you have.  Whether you are pregnant or may be pregnant. What are the risks? Generally, this is a safe test. However, problems may occur, including an allergic reaction to dye (contrast) that may be used during the test. What happens before the test? No specific preparation is needed. You may eat and drink normally. What happens during the test?  You will take off your clothes from the waist up and put on a hospital gown.  Electrodes or electrocardiogram (ECG)patches may be placed on your chest. The electrodes or patches are then connected to a device that monitors your heart rate and rhythm.  You will lie down on a table for an ultrasound exam. A gel will be applied to your chest to help sound waves pass through your skin.  A handheld device, called a transducer, will be pressed against your chest and moved over your heart. The transducer produces sound waves that travel to your heart and bounce back (or "echo" back) to the transducer. These sound waves will be captured in real-time and changed into images of your heart that can be viewed on a video monitor. The images will be recorded on a computer and reviewed by your health care provider.  You may be asked to change positions or hold your breath for a short time. This makes it easier to get different views or better views of your heart.  In some cases, you may receive contrast through an IV in one of your veins. This can improve the quality of the pictures from your heart. The procedure may vary among health care providers and hospitals.   What can I expect after the test? You may return to your normal, everyday life, including diet, activities, and medicines,  unless your health care provider tells you not to do that. Follow these instructions at home:  It is up to you to get the results of your test. Ask your health care provider, or the department that is doing the test, when your results will be ready.  Keep all follow-up visits. This is important. Summary  An echocardiogram is a test that uses sound waves (ultrasound) to produce images of the heart.  Images from an echocardiogram can provide important information about the size and shape of your heart, heart muscle function, heart valve function, and other possible heart problems.  You do not need to do anything to prepare before this test. You may eat and drink normally.  After the echocardiogram is completed, you may return to your normal, everyday life, unless your health care provider tells you not to do that. This information is not intended to replace advice given to you by your health care provider. Make sure you discuss any questions you have with your health care provider. Document Revised: 03/10/2020 Document Reviewed:  03/10/2020 Elsevier Patient Education  2021 [employer].

## 2020-11-13 NOTE — Progress Notes (Signed)
Cardiology Office Note   Date:  11/13/2020   ID:  [name], DOB 03/28/1981, MRN [id number]  PCP:  Anabel Halon, MD  Cardiologist:   Dietrich Pates, MD   Patient referred for evaluation of PVCs by Dr Allena Katz    History of Present Illness: Nicole Pollard is a 40 y.o. female with a history of anxiety, sleep apnea and PVCs  The  PVCs picked up on sleep eval   Th pt says she will feels skips throughout the day, most often at night   Isolated   She will also feel a flip in her throat.   She occasoinally feels her heart racing as well   Spells will last about 30 seconds  These spells occur about 2 to 3 x per month  She denies dizziness  No syncope  No CPC    When not having spells she feels fine    She does note fatgiue if she tries to exercise.           No outpatient medications have been marked as taking for the 11/13/20 encounter (Office Visit) with Pricilla Riffle, MD.     Allergies:   Patient has no known allergies.   Past Medical History:  Diagnosis Date  . Abnormal Pap smear of cervix    12/15/14 - ASCUS + HPV  . Anxiety 01/28/2015   Overview:  On zoloft 50mg   EPDS 7 on 03/18/15 Mood stable  Last Assessment & Plan:  Pt states that her mood is stable on zoloft 50mg   . Anxiety 01/28/2015   Overview:  On zoloft 50mg   EPDS 7 on 03/18/15 Mood stable  Last Assessment & Plan:  Pt states that her mood is stable on zoloft 50mg   Formatting of this note might be different from the original. On zoloft 50mg   EPDS 7 on 03/18/15 Mood stable  Overview:  Overview:  On zoloft 50mg   EPDS 7 on 03/18/15 Mood stable  Last Assessment & Plan:  Pt states that her mood is stable on zoloft 50mg   Last Assessmen  . Cellulitis of abdominal wall 09/01/2016  . Medical history non-contributory   . Short cervix affecting pregnancy 05/23/2015   Cerclage [facility] 04/03/15 at [facility]   . Vaginal Pap smear, abnormal    06/23/14 ASCUS + HPV    Past Surgical History:  Procedure Laterality Date  . ABDOMINOPLASTY   06/28/2016   [place], [place]  . APPENDECTOMY    . CESAREAN SECTION    . TUBAL LIGATION       Social History:  The patient  reports that she has never smoked. She has never used smokeless tobacco. She reports current alcohol use. She reports that she does not use drugs.   Family History:  The patient's family history includes Alcohol abuse in her mother; Anxiety disorder in her maternal aunt; Cancer in her maternal grandmother; Drug abuse in her father; Hypertension in her father.    ROS:  Please see the history of present illness. All other systems are reviewed and  Negative to the above problem except as noted.    PHYSICAL EXAM: VS:  BP 130/72   Pulse (!) 102   Ht 5\' 4"  (1.626 m)   Wt 167 lb (75.8 kg)   SpO2 98%   BMI 28.67 kg/m   GEN: Well nourished, well developed, in no acute distress  HEENT: normal  Neck: no JVD, carotid bruits Cardiac: RRR; no murmurs.  No LE edema  Respiratory:  clear to  auscultation bilaterally, normal work of breathing GI: soft, nontender, nondistended, + BS  No hepatomegaly  MS: no deformity Moving all extremities   Skin: warm and dry, no rash Neuro:  Strength and sensation are intact Psych: euthymic mood, full affect   EKG:  EKG is ordered today.   Lipid Panel    Component Value Date/Time   CHOL 212 (H) 06/18/2020 1111   TRIG 116 06/18/2020 1111   HDL 87 06/18/2020 1111   CHOLHDL 2.4 06/18/2020 1111   LDLCALC 105 (H) 06/18/2020 1111      Wt Readings from Last 3 Encounters:  11/13/20 167 lb (75.8 kg)  09/01/20 162 lb 3.2 oz (73.6 kg)  06/16/20 159 lb (72.1 kg)      ASSESSMENT AND PLAN:  1  Palpitations   Pt with hx of PVCs on sleep study   She continues to have symptoms.   Most isolated   Some as runs    Will set up for a 2 wk monitor to evaluate frequency/burden     2   Dyspnea with exertion   Will set up for echo to evaluate systolic / diastlic function.    Continue activities as tolerated    F/U based on test findings      Current medicines are reviewed at length with the patient today.  The patient does not have concerns regarding medicines.  Signed, Dietrich Pates, MD  11/13/2020 2:21 PM    [facility] [address] [place], [place], [place]  [address] Phone: [phone]; Fax: [phone]

## 2020-11-16 NOTE — Addendum Note (Signed)
Addended by: Leonides Schanz C on: 11/16/2020 12:23 PM   Modules accepted: Orders

## 2020-11-27 DIAGNOSIS — I493 Ventricular premature depolarization: Secondary | ICD-10-CM

## 2020-12-04 ENCOUNTER — Ambulatory Visit (HOSPITAL_COMMUNITY): Admission: RE | Admit: 2020-12-04 | Payer: Medicaid Other | Source: Ambulatory Visit

## 2020-12-08 ENCOUNTER — Encounter: Payer: Self-pay | Admitting: *Deleted

## 2020-12-08 ENCOUNTER — Telehealth: Payer: Self-pay

## 2020-12-08 MED ORDER — METOPROLOL SUCCINATE ER 25 MG PO TB24: 25.0000 mg | ORAL_TABLET | Freq: Every day | ORAL | 3 refills | Status: DC

## 2020-12-08 NOTE — Telephone Encounter (Signed)
-----   Message from Lendon Ka, RN sent at 12/07/2020  4:42 PM EDT ----- Sidney Ace patient.

## 2020-12-08 NOTE — Telephone Encounter (Signed)
Pt verbalized understanding.

## 2020-12-14 ENCOUNTER — Ambulatory Visit: Payer: Medicaid Other | Admitting: Internal Medicine

## 2020-12-16 ENCOUNTER — Other Ambulatory Visit: Payer: Self-pay

## 2020-12-16 ENCOUNTER — Ambulatory Visit (HOSPITAL_COMMUNITY)
Admission: RE | Admit: 2020-12-16 | Discharge: 2020-12-16 | Disposition: A | Discharge status: Home / Self Care | Payer: Medicaid Other | Source: Ambulatory Visit | Attending: Internal Medicine | Admitting: Internal Medicine

## 2020-12-16 DIAGNOSIS — R002 Palpitations: Secondary | ICD-10-CM

## 2020-12-16 DIAGNOSIS — R0602 Shortness of breath: Secondary | ICD-10-CM

## 2020-12-16 LAB — ECHOCARDIOGRAM COMPLETE
AR max vel: 1.67 cm2
AV Area VTI: 1.66 cm2
AV Area mean vel: 1.56 cm2
AV Mean grad: 4.5 mmHg
AV Peak grad: 8 mmHg
Ao pk vel: 1.41 m/s
Area-P 1/2: 3.54 cm2
S' Lateral: 2.5 cm

## 2020-12-16 NOTE — Progress Notes (Signed)
*  PRELIMINARY RESULTS* Echocardiogram 2D Echocardiogram has been performed.  Stacey Drain 12/16/2020, 3:43 PM

## 2020-12-18 ENCOUNTER — Telehealth: Payer: Self-pay | Admitting: Internal Medicine

## 2020-12-18 NOTE — Telephone Encounter (Signed)
-----   Message from Dietrich Pates V, MD sent at 12/16/2020  8:10 PM EDT ----- Echo is normal   LV systolic and diastolic function are normal  Normal valve function After monitor recomm trial of low dose b blocker for palpitatoins   Has this helped palpitatoins     Is her SOB better? If still symptomatic with SOB would recomm regular treadmill to assess exercise tolerance/capacity

## 2020-12-18 NOTE — Telephone Encounter (Signed)
New message    Patient returning call for test results  

## 2020-12-18 NOTE — Telephone Encounter (Signed)
Pt verbalized understanding of result note. Pt stated that her hr is in the 70's. SOB is not as noticeable now, she still c/o feeling a few skipped beats, but states it happens mostly at night

## 2021-03-03 ENCOUNTER — Ambulatory Visit: Payer: Medicaid Other | Admitting: Internal Medicine

## 2021-03-25 ENCOUNTER — Encounter: Payer: Medicaid Other | Admitting: Obstetrics & Gynecology

## 2021-03-29 ENCOUNTER — Ambulatory Visit: Payer: Medicaid Other | Admitting: Internal Medicine

## 2021-03-29 ENCOUNTER — Ambulatory Visit (HOSPITAL_COMMUNITY)
Admission: RE | Admit: 2021-03-29 | Discharge: 2021-03-29 | Disposition: A | Discharge status: Home / Self Care | Payer: Medicaid Other | Source: Ambulatory Visit | Attending: Internal Medicine | Admitting: Internal Medicine

## 2021-03-29 ENCOUNTER — Encounter: Payer: Self-pay | Admitting: Internal Medicine

## 2021-03-29 ENCOUNTER — Other Ambulatory Visit: Payer: Self-pay

## 2021-03-29 VITALS — BP 119/77 | HR 77 | Temp 98.8°F | Resp 18 | Ht 64.0 in | Wt 170.1 lb

## 2021-03-29 DIAGNOSIS — M542 Cervicalgia: Secondary | ICD-10-CM

## 2021-03-29 DIAGNOSIS — E663 Overweight: Secondary | ICD-10-CM | POA: Diagnosis not present

## 2021-03-29 DIAGNOSIS — Z23 Encounter for immunization: Secondary | ICD-10-CM | POA: Diagnosis not present

## 2021-03-29 DIAGNOSIS — E559 Vitamin D deficiency, unspecified: Secondary | ICD-10-CM | POA: Diagnosis not present

## 2021-03-29 DIAGNOSIS — Z0001 Encounter for general adult medical examination with abnormal findings: Secondary | ICD-10-CM | POA: Diagnosis not present

## 2021-03-29 DIAGNOSIS — Z0000 Encounter for general adult medical examination without abnormal findings: Secondary | ICD-10-CM

## 2021-03-29 DIAGNOSIS — E039 Hypothyroidism, unspecified: Secondary | ICD-10-CM

## 2021-03-29 MED ORDER — CYCLOBENZAPRINE HCL 10 MG PO TABS: 10.0000 mg | ORAL_TABLET | Freq: Three times a day (TID) | ORAL | 0 refills | Status: DC | PRN

## 2021-03-29 NOTE — Assessment & Plan Note (Signed)

## 2021-03-29 NOTE — Patient Instructions (Signed)
Please get X-ray of cervical spine at [facility].  Please get fasting blood tests done within a week.

## 2021-03-29 NOTE — Assessment & Plan Note (Signed)
Last vitamin D Lab Results  Component Value Date   VD25OH 19.1 (L) 06/18/2020   Advised to take Vitamin D supplements

## 2021-03-29 NOTE — Assessment & Plan Note (Signed)
Weight gain could be related to dietary change and lack of activity recently during the pandemic Advised to follow low-carb diet and perform moderate exercise/walking at least 150 minutes/week Check TSH and CMP

## 2021-03-29 NOTE — Progress Notes (Addendum)
Acute Office Visit  Subjective:    Patient ID: Nicole Pollard, female    DOB: 06/09/1981, 40 y.o.   MRN: [id number]  Chief Complaint  Patient presents with   Follow-up    Pt missed follow up appt she has gained 35lbs in the last year still having lots of hair loss she had to go to urgent care for her shoulder blade hurting they gave her prednisone and muscle relaxer told her to follow up with pcp     HPI Patient is in today for evaluation of neck pain, which has been chronic, but has been worse for last 1 month. Pain is constant, sharp, radiating towards left shoulder and scapular area.  She denies any numbness, tingling or weakness of the left LE.  Denies any recent injury.  She had a visit at urgent care, and she was given prednisone and Robaxin as needed.  Her pain improved a little bit with prednisone.  She also complains of unintentional weight gain of about 35 lbs in last one and a half year.  She also reports chronic hair loss, which improved initially with zinc and biotin supplements, but has not been taking it regularly.  Past Medical History:  Diagnosis Date   Abnormal Pap smear of cervix    12/15/14 - ASCUS + HPV   Anxiety 01/28/2015   Overview:  On zoloft 58m  EPDS 7 on 03/18/15 Mood stable  Last Assessment & Plan:  Pt states that her mood is stable on zoloft 564m  Anxiety 01/28/2015   Overview:  On zoloft 5042mEPDS 7 on 03/18/15 Mood stable  Last Assessment & Plan:  Pt states that her mood is stable on zoloft 21m5mormatting of this note might be different from the original. On zoloft 21mg96mDS 7 on 03/18/15 Mood stable  Overview:  Overview:  On zoloft 21mg 69mS 7 on 03/18/15 Mood stable  Last Assessment & Plan:  Pt states that her mood is stable on zoloft 21mg  92m Assessmen   Cellulitis of abdominal wall 09/01/2016   Medical history non-contributory    Short cervix affecting pregnancy 05/23/2015   [facility]     06/23/14 ASCUS + HPV    Past Surgical History:  Procedure Laterality Date   ABDOMINOPLASTY  06/28/2016   [place]   [place]     CES[place]AN SECTION     TUBAL LIGATION      Family History  Problem Relation Age of Onset   Alcohol abuse Mother    Drug abuse Father    Hypertension Father    Anxiety disorder Maternal Aunt    Cancer Maternal Grandmother        Cervical    Social History   Socioeconomic History   Marital status: Married    Spouse name: Not on file   Number of children: Not on file   Years of education: Not on file   Highest education level: Not on file  Occupational History   Not on file  Tobacco Use   Smoking status: Never   Smokeless tobacco: Never  Substance and Sexual Activity   Alcohol use: Yes    Comment: Social   Drug use: No   Sexual activity: Yes    Birth control/protection: Surgical  Other Topics Concern   Not on file  Social History Narrative   Not on file   Social Determinants of Health  Financial Resource Strain: Not on file  Food Insecurity: Not on file  Transportation Needs: Not on file  Physical Activity: Not on file  Stress: Not on file  Social Connections: Not on file  Intimate Partner Violence: Not on file    Outpatient Medications Prior to Visit  Medication Sig Dispense Refill   metoprolol succinate (TOPROL XL) 25 MG 24 hr tablet Take 1 tablet (25 mg total) by mouth daily. 90 tablet 3   predniSONE (STERAPRED UNI-PAK 21 TAB) 5 MG (21) TBPK tablet Take by mouth as directed.     methocarbamol (ROBAXIN) 500 MG tablet Take 1,000 mg by mouth every 6 (six) hours as needed.     No facility-administered medications prior to visit.    No Known Allergies  Review of Systems  Constitutional:  Positive for unexpected weight change. Negative for chills and fever.  HENT:  Negative for congestion, sinus pressure, sinus pain and sore throat.   Eyes:  Negative for pain and discharge.  Respiratory:  Negative for cough and shortness  of breath.   Cardiovascular:  Negative for chest pain and palpitations.  Gastrointestinal:  Positive for constipation. Negative for abdominal pain, diarrhea, nausea and vomiting.  Endocrine: Negative for polydipsia and polyuria.  Genitourinary:  Negative for dysuria and hematuria.  Musculoskeletal:  Positive for neck pain. Negative for neck stiffness.  Skin:  Negative for rash.  Neurological:  Negative for dizziness and weakness.  Psychiatric/Behavioral:  Negative for agitation and behavioral problems.       Objective:    Physical Exam Vitals reviewed.  Constitutional:      General: She is not in acute distress.    Appearance: She is not diaphoretic.  HENT:     Head: Normocephalic and atraumatic.     Nose: Nose normal.     Mouth/Throat:     Mouth: Mucous membranes are moist.  Eyes:     General: No scleral icterus.    Extraocular Movements: Extraocular movements intact.     Pupils: Pupils are equal, round, and reactive to light.  Cardiovascular:     Rate and Rhythm: Normal rate and regular rhythm.     Pulses: Normal pulses.     Heart sounds: Normal heart sounds. No murmur heard. Pulmonary:     Breath sounds: Normal breath sounds. No wheezing or rales.  Abdominal:     Palpations: Abdomen is soft.     Tenderness: There is no abdominal tenderness.  Musculoskeletal:        General: Tenderness (Left paraspinal in cervical spine area) present.     Cervical back: Neck supple. No tenderness.     Right lower leg: No edema.     Left lower leg: No edema.  Skin:    General: Skin is warm.     Findings: No rash.  Neurological:     General: No focal deficit present.     Mental Status: She is alert and oriented to person, place, and time.     Sensory: No sensory deficit.     Motor: No weakness.  Psychiatric:        Mood and Affect: Mood normal.        Behavior: Behavior normal.    BP 119/77 (BP Location: Left Arm, Patient Position: Sitting, Cuff Size: Normal)   Pulse 77   Temp  98.8 F (37.1 C) (Oral)   Resp 18   Ht '5\' 4"'  (1.626 m)   Wt 170 lb 1.9 oz (77.2 kg)   LMP 03/22/2021 (Approximate)  SpO2 97%   BMI 29.20 kg/m  Wt Readings from Last 3 Encounters:  03/29/21 170 lb 1.9 oz (77.2 kg)  11/13/20 167 lb (75.8 kg)  09/01/20 162 lb 3.2 oz (73.6 kg)    Health Maintenance Due  Topic Date Due   COVID-19 Vaccine (1) Never done   PAP SMEAR-Modifier  02/10/2020    There are no preventive care reminders to display for this patient.   Lab Results  Component Value Date   TSH 4.800 (H) 03/30/2021   Lab Results  Component Value Date   WBC 7.4 03/30/2021   HGB 13.8 03/30/2021   HCT 41.8 03/30/2021   MCV 95 03/30/2021   PLT 358 03/30/2021   Lab Results  Component Value Date   NA 139 03/30/2021   K 4.5 03/30/2021   CO2 23 03/30/2021   GLUCOSE 85 03/30/2021   BUN 8 03/30/2021   CREATININE 0.63 03/30/2021   BILITOT 0.3 03/30/2021   ALKPHOS 71 03/30/2021   AST 12 03/30/2021   ALT 9 03/30/2021   PROT 6.2 03/30/2021   ALBUMIN 4.2 03/30/2021   CALCIUM 8.8 03/30/2021   ANIONGAP 9 07/25/2017   EGFR 116 03/30/2021   Lab Results  Component Value Date   CHOL 201 (H) 03/30/2021   Lab Results  Component Value Date   HDL 64 03/30/2021   Lab Results  Component Value Date   LDLCALC 113 (H) 03/30/2021   Lab Results  Component Value Date   TRIG 140 03/30/2021   Lab Results  Component Value Date   CHOLHDL 3.1 03/30/2021   Lab Results  Component Value Date   HGBA1C 5.1 03/30/2021       Assessment & Plan:   Problem List Items Addressed This Visit             Cervical pain (neck) - Primary    Acute on chronic Could be cervical spondylosis in addition to muscle strain Flexeril as needed for now Check x-ray of cervical spine      Relevant Medications   cyclobenzaprine (FLEXERIL) 10 MG tablet   Other Relevant Orders   DG Cervical Spine Complete   Overweight    Weight gain could be related to dietary change and lack of activity  recently during the pandemic Advised to follow low-carb diet and perform moderate exercise/walking at least 150 minutes/week Check TSH and CMP      Relevant Orders   TSH+T4F+T3Free   Vitamin D deficiency    Last vitamin D Lab Results  Component Value Date   VD25OH 19.1 (L) 06/18/2020  Advised to take Vitamin D supplements      Relevant Orders   Vitamin D (25 hydroxy)    Other  Need for immunization against influenza   Patient was educated on the recommendation for flu vaccine. After obtaining informed consent, the vaccine was administered no adverse effects noted at time of administration. Patient provided with education on arm soreness and use of tylenol or ibuprofen (if safe) for this. Encourage to use the arm vaccine was given in to help reduce the soreness. Patient educated on the signs of a reaction to the vaccine and advised to contact the office should these occur.     Relevant Orders  Flu Vaccine QUAD 37moIM (Fluarix, Fluzone & Alfiuria Quad PF) (Completed)       Meds ordered this encounter  Medications   cyclobenzaprine (FLEXERIL) 10 MG tablet    Sig: Take 1 tablet (10 mg total) by mouth 3 (three) times  daily as needed for muscle spasms.    Dispense:  60 tablet    Refill:  0   levothyroxine (SYNTHROID) 25 MCG tablet    Sig: Take 1 tablet (25 mcg total) by mouth daily.    Dispense:  30 tablet    Refill:  3     Anastasio Wogan Keith Rake, MD

## 2021-03-29 NOTE — Assessment & Plan Note (Signed)
Acute on chronic Could be cervical spondylosis in addition to muscle strain Flexeril as needed for now Check x-ray of cervical spine

## 2021-03-31 DIAGNOSIS — E039 Hypothyroidism, unspecified: Secondary | ICD-10-CM | POA: Insufficient documentation

## 2021-03-31 LAB — CMP14+EGFR
ALT: 9 IU/L (ref 0–32)
AST: 12 IU/L (ref 0–40)
Albumin/Globulin Ratio: 2.1 (ref 1.2–2.2)
Albumin: 4.2 g/dL (ref 3.8–4.8)
Alkaline Phosphatase: 71 IU/L (ref 44–121)
BUN/Creatinine Ratio: 13 (ref 9–23)
BUN: 8 mg/dL (ref 6–20)
Bilirubin Total: 0.3 mg/dL (ref 0.0–1.2)
CO2: 23 mmol/L (ref 20–29)
Calcium: 8.8 mg/dL (ref 8.7–10.2)
Chloride: 103 mmol/L (ref 96–106)
Creatinine, Ser: 0.63 mg/dL (ref 0.57–1.00)
Globulin, Total: 2 g/dL (ref 1.5–4.5)
Glucose: 85 mg/dL (ref 65–99)
Potassium: 4.5 mmol/L (ref 3.5–5.2)
Sodium: 139 mmol/L (ref 134–144)
Total Protein: 6.2 g/dL (ref 6.0–8.5)
eGFR: 116 mL/min/{1.73_m2} (ref >59)

## 2021-03-31 LAB — TSH+T4F+T3FREE
Free T4: 1.07 ng/dL (ref 0.82–1.77)
T3, Free: 3.1 pg/mL (ref 2.0–4.4)
TSH: 4.8 u[IU]/mL — ABNORMAL HIGH (ref 0.450–4.500)

## 2021-03-31 LAB — HEMOGLOBIN A1C
Est. average glucose Bld gHb Est-mCnc: 100 mg/dL
Hgb A1c MFr Bld: 5.1 % (ref 4.8–5.6)

## 2021-03-31 LAB — LIPID PANEL
Chol/HDL Ratio: 3.1 ratio (ref 0.0–4.4)
Cholesterol, Total: 201 mg/dL — ABNORMAL HIGH (ref 100–199)
HDL: 64 mg/dL (ref >39)
LDL Chol Calc (NIH): 113 mg/dL — ABNORMAL HIGH (ref 0–99)
Triglycerides: 140 mg/dL (ref 0–149)
VLDL Cholesterol Cal: 24 mg/dL (ref 5–40)

## 2021-03-31 LAB — CBC WITH DIFFERENTIAL/PLATELET
Basophils Absolute: 0.1 10*3/uL (ref 0.0–0.2)
Basos: 1 %
EOS (ABSOLUTE): 0.1 10*3/uL (ref 0.0–0.4)
Eos: 2 %
Hematocrit: 41.8 % (ref 34.0–46.6)
Hemoglobin: 13.8 g/dL (ref 11.1–15.9)
Immature Grans (Abs): 0 10*3/uL (ref 0.0–0.1)
Immature Granulocytes: 0 %
Lymphocytes Absolute: 2.5 10*3/uL (ref 0.7–3.1)
Lymphs: 34 %
MCH: 31.4 pg (ref 26.6–33.0)
MCHC: 33 g/dL (ref 31.5–35.7)
MCV: 95 fL (ref 79–97)
Monocytes Absolute: 0.7 10*3/uL (ref 0.1–0.9)
Monocytes: 10 %
Neutrophils Absolute: 3.9 10*3/uL (ref 1.4–7.0)
Neutrophils: 53 %
Platelets: 358 10*3/uL (ref 150–450)
RBC: 4.39 x10E6/uL (ref 3.77–5.28)
RDW: 12.5 % (ref 11.7–15.4)
WBC: 7.4 10*3/uL (ref 3.4–10.8)

## 2021-03-31 LAB — VITAMIN D 25 HYDROXY (VIT D DEFICIENCY, FRACTURES): Vit D, 25-Hydroxy: 24.9 ng/mL — ABNORMAL LOW (ref 30.0–100.0)

## 2021-03-31 MED ORDER — LEVOTHYROXINE SODIUM 25 MCG PO TABS
25.0000 ug | ORAL_TABLET | Freq: Every day | ORAL | 3 refills | Status: DC
Start: 2021-03-31 — End: 2021-08-17

## 2021-03-31 NOTE — Addendum Note (Signed)
Addended byTrena Platt on: 03/31/2021 08:14 AM   Modules accepted: Orders

## 2021-04-01 ENCOUNTER — Ambulatory Visit (INDEPENDENT_AMBULATORY_CARE_PROVIDER_SITE_OTHER): Payer: Medicaid Other | Admitting: Obstetrics & Gynecology

## 2021-04-01 ENCOUNTER — Other Ambulatory Visit: Payer: Self-pay

## 2021-04-01 ENCOUNTER — Encounter: Payer: Self-pay | Admitting: Obstetrics & Gynecology

## 2021-04-01 ENCOUNTER — Other Ambulatory Visit (HOSPITAL_COMMUNITY)
Admission: RE | Admit: 2021-04-01 | Discharge: 2021-04-01 | Disposition: A | Discharge status: Home / Self Care | Payer: Medicaid Other | Source: Ambulatory Visit | Attending: Obstetrics & Gynecology | Admitting: Obstetrics & Gynecology

## 2021-04-01 VITALS — BP 134/78 | HR 78 | Ht 63.0 in | Wt 168.4 lb

## 2021-04-01 DIAGNOSIS — Z01419 Encounter for gynecological examination (general) (routine) without abnormal findings: Secondary | ICD-10-CM | POA: Insufficient documentation

## 2021-04-01 NOTE — Progress Notes (Signed)
WELL-WOMAN EXAMINATION Patient name: Nicole Pollard MRN [id number]  Date of birth: 1981/04/29 Chief Complaint:   Gynecologic Exam  History of Present Illness:   Nicole Pollard is a 40 y.o. [phone]  female being seen today for a routine well-woman exam.  Today she notes the following  -Bulge at umbilicus- not painful, noted after her surgery -Knot along her suture line- on occasion will feel discomfort with clothing like jeans- curious to know if this can be removed.   Patient's last menstrual period was 03/25/2021 (approximate). Menses are irregular- may skip a month.  Typically menses q 42mos, menses last for about 4 days, no HMB.  Denies postcoital or intermenstrual bleeding. The current method of family planning is tubal ligation.   Same partner x 15yrs- declined STI screening   Last pap 2018.  Last mammogram: n/a. Last colonoscopy: n/a  Depression screen [facility] 2/9 04/01/2021 03/29/2021 06/16/2020  Decreased Interest 0 0 0  Down, Depressed, Hopeless 0 0 0  PHQ - 2 Score 0 0 0  Altered sleeping 3 - -  Tired, decreased energy 2 - -  Change in appetite 0 - -  Feeling bad or failure about yourself  0 - -  Trouble concentrating 2 - -  Moving slowly or fidgety/restless 0 - -  Suicidal thoughts 0 - -  PHQ-9 Score 7 - -      Review of Systems:   Pertinent items are noted in HPI Denies any headaches, blurred vision, fatigue, shortness of breath, chest pain, abdominal pain, bowel movements, urination, or intercourse unless otherwise stated above.  Pertinent History Reviewed:  Reviewed past medical,surgical, social and family history.  Reviewed problem list, medications and allergies. Physical Assessment:   Vitals:   04/01/21 1036  BP: 134/78  Pulse: 78  Weight: 168 lb 6.4 oz (76.4 kg)  Height: 5\' 3"  (1.6 m)  Body mass index is 29.83 kg/m.        Physical Examination:   General appearance - well appearing, and in no distress  Mental status - alert, oriented to person,  place, and time  Psych:  She has a normal mood and affect  Skin - warm and dry, normal color, no suspicious lesions noted  Chest - effort normal, all lung fields clear to auscultation bilaterally  Heart - normal rate and regular rhythm  Neck:  midline trachea, no thyromegaly or nodules  Breasts - breasts appear normal, no suspicious masses, no skin or nipple changes or  axillary nodes, bilateral implants noted  Abdomen - soft, nontender, nondistended, no masses or organomegaly, small umbilical hernia noted.  Along low transverse suture line- subQ stitch appreciated  Pelvic - VULVA: normal appearing vulva with no masses, tenderness or lesions  VAGINA: normal appearing vagina with normal color and discharge, no lesions  CERVIX: normal appearing cervix without discharge or lesions, no CMT  Thin prep pap is done with HR HPV cotesting  UTERUS: uterus is felt to be normal size, shape, consistency and nontender   ADNEXA: No adnexal masses or tenderness noted.  Extremities:  No swelling or varicosities noted  Chaperone:     Assessment & Plan:  1) Well-Woman Exam -pap collected  2) Umbilical Hernia -reviewed warning signs/incarcerated hernia, now management indicated at this time -discussed palpable suture and reviewed surgical removal- pt declined at this time   Meds: No orders of the defined types were placed in this encounter.   Follow-up: Return in about 1 year (around 04/01/2022) for Annual.  Janyth Pupa, DO Attending [place], [facility] for [employer], [place]

## 2021-04-04 ENCOUNTER — Emergency Department (HOSPITAL_COMMUNITY)
Admission: EM | Admit: 2021-04-04 | Discharge: 2021-04-05 | Disposition: A | Discharge status: Home / Self Care | Payer: Medicaid Other | Attending: Emergency Medicine | Admitting: Emergency Medicine

## 2021-04-04 ENCOUNTER — Other Ambulatory Visit: Payer: Self-pay

## 2021-04-04 ENCOUNTER — Encounter (HOSPITAL_COMMUNITY): Payer: Self-pay | Admitting: Emergency Medicine

## 2021-04-04 DIAGNOSIS — H538 Other visual disturbances: Secondary | ICD-10-CM | POA: Diagnosis present

## 2021-04-04 DIAGNOSIS — Z79899 Other long term (current) drug therapy: Secondary | ICD-10-CM | POA: Insufficient documentation

## 2021-04-04 DIAGNOSIS — H5702 Anisocoria: Secondary | ICD-10-CM

## 2021-04-04 DIAGNOSIS — E039 Hypothyroidism, unspecified: Secondary | ICD-10-CM | POA: Diagnosis not present

## 2021-04-04 HISTORY — DX: Hypothyroidism, unspecified: E03.9

## 2021-04-04 NOTE — ED Triage Notes (Signed)
Pt c/o eye itching earlier today and felt like maybe she had something in her eye. Friend noticed that her pupils were different and advised her to come be seen. Pt states her vision from right eye is a little blurry.

## 2021-04-05 ENCOUNTER — Emergency Department (HOSPITAL_COMMUNITY): Payer: Medicaid Other

## 2021-04-05 MED ORDER — TETRACAINE HCL 0.5 % OP SOLN
2.0000 [drp] | Freq: Once | OPHTHALMIC | Status: AC
  Administered 2021-04-05: 2 [drp] via OPHTHALMIC
  Filled 2021-04-05: qty 4

## 2021-04-05 NOTE — ED Notes (Signed)
Patient transported to CT 

## 2021-04-05 NOTE — Discharge Instructions (Addendum)
At this point it does not appear that there is anything dangerous or serious causing your pupil abnormality.  Dr. Genia Del will see you on Tuesday in his office.  Call in the morning and tell them that you are an ER follow-up and he wants to see you for an exam.

## 2021-04-05 NOTE — ED Provider Notes (Signed)
[facility] EMERGENCY DEPARTMENT Provider Note   CSN: [id number] Arrival date & time: 04/04/21  2312     History Chief Complaint  Patient presents with   Eye Problem    Nicole Pollard is a 40 y.o. female.  Patient reports that she noticed some blurred vision in her right eye earlier today.  She notices difficulty transitioning from close vision to distance vision.  She felt like maybe there was something in her eye.  She irrigated the eye with saline but it did not help.  When she looked in the mirror she noticed her right pupil was larger than the left.  She denies any trauma.  There is no ocular pain.      Past Medical History:  Diagnosis Date   Abnormal Pap smear of cervix    12/15/14 - ASCUS + HPV   Anxiety 01/28/2015   Overview:  On zoloft 50mg   EPDS 7 on 03/18/15 Mood stable  Last Assessment & Plan:  Pt states that her mood is stable on zoloft 50mg    Anxiety 01/28/2015   Overview:  On zoloft 50mg   EPDS 7 on 03/18/15 Mood stable  Last Assessment & Plan:  Pt states that her mood is stable on zoloft 50mg   Formatting of this note might be different from the original. On zoloft 50mg   EPDS 7 on 03/18/15 Mood stable  Overview:  Overview:  On zoloft 50mg   EPDS 7 on 03/18/15 Mood stable  Last Assessment & Plan:  Pt states that her mood is stable on zoloft 50mg   Last Assessmen   Cellulitis of abdominal wall 09/01/2016   Hypothyroid    Medical history non-contributory    Short cervix affecting pregnancy 05/23/2015   [facility]    Vaginal Pap smear, abnormal    06/23/14 ASCUS + HPV    Patient Active Problem List   Diagnosis Date Noted   Hypothyroidism 03/31/2021   Cervical pain (neck) 03/29/2021   Overweight 03/29/2021   Vitamin D deficiency 03/29/2021   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   Hair loss 06/16/2020   Need for immunization against influenza 06/16/2020   Chronic constipation 06/16/2020   Snoring 06/16/2020   Abnormal cervical Papanicolaou smear  10/10/2017   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 Pregnancy history some records show Gravida 7 others show Gravida 8         Family History  Problem Relation Age of Onset   Alcohol abuse Mother    Drug abuse Father    Hypertension Father    Anxiety disorder Maternal Aunt  Cancer Maternal Grandmother        Cervical    Social History   Tobacco Use   Smoking status: Never   Smokeless tobacco: Never  Substance Use Topics   Alcohol use: Yes    Comment: Social   Drug use: No    Home Medications Prior to Admission medications   Medication Sig Start Date End Date Taking? Authorizing Provider  cholecalciferol (VITAMIN D3) 25 MCG (1000 UNIT) tablet Take 1,000 Units by mouth daily.    [provider]  cyclobenzaprine (FLEXERIL) 10 MG tablet Take 1 tablet (10 mg total) by mouth 3 (three) times daily as needed for muscle spasms. 03/29/21   Anabel Halon, MD  levothyroxine (SYNTHROID) 25 MCG tablet Take 1 tablet (25 mcg total) by mouth daily. 03/31/21   Anabel Halon, MD  metoprolol succinate (TOPROL XL) 25 MG 24 hr tablet Take 1 tablet (25 mg total) by mouth daily. 12/08/20   Pricilla Riffle, MD  omeprazole (PRILOSEC) 20 MG capsule Take 1 capsule (20 mg total) by mouth daily. 07/25/17 02/21/20  Eber Hong, MD  ranitidine (ZANTAC) 150 MG tablet Take 1 tablet (150 mg  total) by mouth 2 (two) times daily. 07/25/17 01/26/20  Eber Hong, MD    Allergies    Patient has no known allergies.  Review of Systems   Review of Systems  Eyes:  Positive for visual disturbance. Negative for pain.  All other systems reviewed and are negative.  Physical Exam Updated Vital Signs BP (!) 129/91   Pulse 82   Temp 98.5 F (36.9 C) (Oral)   Resp 20   Ht 5\' 3"  (1.6 m)   Wt 76.4 kg   LMP 03/25/2021 (Approximate)   SpO2 97%   BMI 29.83 kg/m   Physical Exam Vitals and nursing note reviewed.  Constitutional:      General: She is not in acute distress.    Appearance: Normal appearance. She is well-developed.  HENT:     Head: Normocephalic and atraumatic.     Right Ear: Hearing normal.     Left Ear: Hearing normal.     Nose: Nose normal.  Eyes:     General: Lids are normal. Vision grossly intact. Gaze aligned appropriately.     Intraocular pressure: Right eye pressure is 16 mmHg.     Extraocular Movements: Extraocular movements intact.     Right eye: Normal extraocular motion.     Left eye: Normal extraocular motion.     Conjunctiva/sclera: Conjunctivae normal.     Right eye: Right conjunctiva is not injected. No chemosis, exudate or hemorrhage.    Left eye: Left conjunctiva is not injected. No chemosis, exudate or hemorrhage.    Pupils: Pupils are unequal.     Funduscopic exam:    Right eye: No hemorrhage, exudate or papilledema.     Slit lamp exam:    Right eye: Anterior chamber quiet. No photophobia.  Cardiovascular:     Rate and Rhythm: Regular rhythm.     Heart sounds: S1 normal and S2 normal. No murmur heard.   No friction rub. No gallop.  Pulmonary:     Effort: Pulmonary effort is normal. No respiratory distress.     Breath sounds: Normal breath sounds.  Chest:     Chest wall: No tenderness.  Abdominal:     General: Bowel sounds are normal.     Palpations: Abdomen is soft.     Tenderness: There is no abdominal tenderness. There is no  guarding or rebound. Negative signs include Murphy's sign and McBurney's sign.     Hernia: No hernia is present.  Musculoskeletal:        General: Normal range of motion.     Cervical back: Normal range of motion and neck supple.  Skin:    General: Skin is warm and dry.     Findings: No rash.  Neurological:     Mental Status: She is alert and oriented to person, place, and time.     GCS: GCS eye subscore is 4. GCS verbal subscore is 5. GCS motor subscore is 6.     Cranial Nerves: No cranial nerve deficit.     Sensory: No sensory deficit.     Coordination: Coordination normal.  Psychiatric:        Speech: Speech normal.        Behavior: Behavior normal.        Thought Content: Thought content normal.    ED Results / Procedures / Treatments   Labs (all labs ordered are listed, but only abnormal results are displayed) Labs Reviewed - No data to display  EKG None  Radiology CT HEAD WO CONTRAST ( )  Result Date: 04/05/2021 CLINICAL DATA:  Neuro deficit, acute, stroke suspected. Blurred vision right eye EXAM: CT HEAD WITHOUT CONTRAST TECHNIQUE: Contiguous axial images were obtained from the base of the skull through the vertex without intravenous contrast. COMPARISON:  None. FINDINGS: Brain: No acute intracranial abnormality. Specifically, no hemorrhage, hydrocephalus, mass lesion, acute infarction, or significant intracranial injury. Vascular: No hyperdense vessel or unexpected calcification. Skull: No acute calvarial abnormality. Sinuses/Orbits: No acute findings Other: None IMPRESSION: No acute intracranial abnormality. Electronically Signed   By: Charlett Nose M.D.   On: 04/05/2021 01:14    Procedures Procedures   Medications Ordered in ED Medications  tetracaine (PONTOCAINE) 0.5 % ophthalmic solution 2 drop (2 drops Right Eye Given by Other 04/05/21 2536)    ED Course  I have reviewed the triage vital signs and the nursing notes.  Pertinent labs & imaging results that were  available during my care of the patient were reviewed by me and considered in my medical decision making (see chart for details).    MDM Rules/Calculators/A&P                           Patient presents to the emergency department for evaluation of isolated anisocoria first noted today.  Patient reports that she was noticing blurred vision and difficulty changing from near to far vision.  When she checked her eyes in the mirror she noticed the pupil on the right was much larger than the left.  She is not on any medications that could do this.  This includes medications that could accidentally contaminate her eye.  No use of pesticides.  There is no ocular pain.  Intraocular pressure is 16, this is not an acute glaucoma.  Anterior chamber is normal.  Pupil is responsive, just larger than the other.  This includes consensual response to light.  Funduscopic exam is normal, no papilledema.  Anterior chamber is normal.  Suspect that there is pharmacologic contamination of the eye causing this but will likely require some further work-up if it does not resolve on its own.  She does report that she may have had an ocular migraine once before in her life but does not have any headache, eye pain currently, no aura noted.  Discussed with Dr. Genia Del, on-call for  ophthalmology.  He does not feel that she requires any further work-up in the ED tonight, will see her in the office.  Final Clinical Impression(s) / ED Diagnoses Final diagnoses:  Anisocoria    Rx / DC Orders ED Discharge Orders     None        Kayte Borchard, Canary Brim, MD 04/05/21 [phone]

## 2021-04-06 LAB — CYTOLOGY - PAP
Adequacy: ABSENT
Chlamydia: NEGATIVE
Comment: NEGATIVE
Comment: NEGATIVE
Comment: NORMAL
Diagnosis: NEGATIVE
High risk HPV: NEGATIVE
Neisseria Gonorrhea: NEGATIVE

## 2021-04-22 ENCOUNTER — Encounter: Payer: Self-pay | Admitting: Internal Medicine

## 2021-05-01 ENCOUNTER — Encounter: Payer: Self-pay | Admitting: Internal Medicine

## 2021-05-01 DIAGNOSIS — R0683 Snoring: Secondary | ICD-10-CM

## 2021-05-02 NOTE — Telephone Encounter (Signed)
VS please advise. Thanks! 

## 2021-05-03 NOTE — Telephone Encounter (Signed)
Please place order for overnight oximetry on room air.

## 2021-05-03 NOTE — Telephone Encounter (Signed)
VS---this is from the pt.  Ok to order the ONO?    Room air?  Thanks   Thank you for your response. I think I'd like to try option 3 for the oxygen therapy to start. Depending on those results possibly opt for ordering the cpap machine even if I have to pay out of pocket. I would possibly just need a few recommendations on which machine to order. The price difference is pretty drastic so I'd decide based on researching those different machines. If there are certain features that you think are important then you can let me know so that I order the best one. On the other hand if a $300 one would suit me just as well then id go with that one.  Theoretically I'd like to do the sleep study again as you suggested but by the time I pay out of pocket, then I may as well just purchase the machine.  Thanks for being so attentive!

## 2021-05-10 ENCOUNTER — Telehealth: Payer: Self-pay | Admitting: Pulmonary Disease

## 2021-05-10 NOTE — Telephone Encounter (Signed)
ONO with RA 05/05/21 >> test time 7 hrs 20 min.  Mean SpO2 95.01%, low SpO2 91%.  Please let her know her overnight oxygen test did not show low oxygen level while she was asleep.  Please schedule ROV with me to discuss her sleep issues in more detail.  Okay to double book visit.

## 2021-05-12 NOTE — Telephone Encounter (Signed)
Called and spoke with patient to let her know of ONO results. She expressed understanding and I have scheduled her with Dr. Craige Cotta on 10/24 at 11:45 since he said it was ok to double book. Nothing further needed at this time.

## 2021-05-24 ENCOUNTER — Other Ambulatory Visit: Payer: Self-pay

## 2021-05-24 ENCOUNTER — Encounter: Payer: Self-pay | Admitting: Pulmonary Disease

## 2021-05-24 ENCOUNTER — Ambulatory Visit: Payer: Medicaid Other | Admitting: Pulmonary Disease

## 2021-05-24 VITALS — BP 124/78 | HR 78 | Temp 98.2°F | Ht 63.0 in | Wt 173.0 lb

## 2021-05-24 DIAGNOSIS — E039 Hypothyroidism, unspecified: Secondary | ICD-10-CM | POA: Diagnosis not present

## 2021-05-24 DIAGNOSIS — R635 Abnormal weight gain: Secondary | ICD-10-CM | POA: Diagnosis not present

## 2021-05-24 DIAGNOSIS — R0683 Snoring: Secondary | ICD-10-CM

## 2021-05-24 NOTE — Progress Notes (Signed)
Nicole Pollard, Critical Care, and Sleep Medicine  Chief Complaint  Patient presents with   Follow-up    SOB during exertion. Feels she has always experienced this but it is worse than usual lately. Patient states she feels that sleep study could have been affected because of being in lab. 09/2020 sleep study.     Constitutional:  BP 124/78   Pulse 78   Temp 98.2 F (36.8 C) (Temporal)   Ht 5\' 3"  (1.6 m)   Wt 173 lb 0.6 oz (78.5 kg)   SpO2 98%   BMI 30.65 kg/m   Past Medical History:  Anxiety, Hypothyroidism, Tachycardia  Past Surgical History:  She  has a past surgical history that includes Cesarean section; Appendectomy; Abdominoplasty (06/28/2016); and Tubal ligation.  Brief Summary:  Nicole Pollard is a 40 y.o. female with snoring.      Subjective:   She had in lab sleep study in February.  Had trouble falling asleep and had several awakenings during the study.  Since then she has gained about 15 lbs since then.  She was also found to have hypothyroidism and started on thyroid replacement.  She is feeling more tired during the day.  She is snoring more and feels like her breathing is worse when she is asleep.  She was also started on metoprolol for tachycardia and PVCs, but still has sensation of intermittent palpitations.  Physical Exam:   Appearance - well kempt   ENMT - no sinus tenderness, no oral exudate, no LAN, Mallampati 4 airway, no stridor  Respiratory - equal breath sounds bilaterally, no wheezing or rales  CV - s1s2 regular rate and rhythm, no murmurs  Ext - no clubbing, no edema  Skin - no rashes  Psych - normal mood and affect    Sleep Tests:  PSG 09/15/20 >> AHI 0.7, SpO2 low 93% ONO with RA 05/05/21 >> test time 7 hrs 20 min.  Mean SpO2 95.01%, low SpO2 91%.  Cardiac Tests:  Echo 12/16/20 >> EF 60 to 65%  Social History:  She  reports that she has never smoked. She has never used smokeless tobacco. She reports current alcohol use. She  reports that she does not use drugs.  Family History:  Her family history includes Alcohol abuse in her mother; Anxiety disorder in her maternal aunt; Cancer in her maternal grandmother; Drug abuse in her father; Hypertension in her father.    Discussion:  Since her original sleep study she has gained significant amount of weight, been diagnosed with PVCs and tachycardia, and diagnosed with hypothyroidism.  With these changes she is having more snoring, apnea episodes, sleep disruption, and daytime sleepiness.  I am concerned she might now have significant obstructive sleep apnea.  She tried in lab sleep study previously, but had trouble falling asleep and staying asleep during the study.  Assessment/Plan:   Snoring with excessive daytime sleepiness. - will see if her insurance will authorize a home sleep study - if she has to do an in lab study again, then might need to use a sleep aide medication on the night of the study  Obesity. - discussed how weight can impact sleep and risk for sleep disordered breathing - discussed options to assist with weight loss: combination of diet modification, cardiovascular and strength training exercises  Palpitations. - advised her to d/w her PCP if this progresses  Cardiovascular risk. - had an extensive discussion regarding the adverse health consequences related to untreated sleep disordered breathing - specifically  discussed the risks for hypertension, coronary artery disease, cardiac dysrhythmias, cerebrovascular disease, and diabetes - lifestyle modification discussed  Safe driving practices. - discussed how sleep disruption can increase risk of accidents, particularly when driving - safe driving practices were discussed  Therapies for obstructive sleep apnea. - if the sleep study shows significant sleep apnea, then various therapies for treatment were reviewed: CPAP, oral appliance, and surgical interventions  Time Spent Involved in Patient  Care on Day of Examination:  34 minutes  Follow up:   Patient Instructions  Will arrange for home sleep study  Will call to arrange for follow up after sleep study reviewed  Medication List:   Allergies as of 05/24/2021   No Known Allergies      Medication List        Accurate as of May 24, 2021 11:53 AM. If you have any questions, ask your nurse or doctor.          cholecalciferol 25 MCG (1000 UNIT) tablet Commonly known as: VITAMIN D3 Take 1,000 Units by mouth daily.   cyclobenzaprine 10 MG tablet Commonly known as: FLEXERIL Take 1 tablet (10 mg total) by mouth 3 (three) times daily as needed for muscle spasms.   levothyroxine 25 MCG tablet Commonly known as: SYNTHROID Take 1 tablet (25 mcg total) by mouth daily.   metoprolol succinate 25 MG 24 hr tablet Commonly known as: Toprol XL Take 1 tablet (25 mg total) by mouth daily.        Signature:  Coralyn Helling, MD [facility]/Critical Care Pager - [phone] 05/24/2021, 11:53 AM

## 2021-05-24 NOTE — Patient Instructions (Signed)
Will arrange for home sleep study Will call to arrange for follow up after sleep study reviewed  

## 2021-05-31 ENCOUNTER — Encounter: Payer: Self-pay | Admitting: Internal Medicine

## 2021-05-31 ENCOUNTER — Ambulatory Visit: Payer: Medicaid Other | Admitting: Internal Medicine

## 2021-05-31 ENCOUNTER — Other Ambulatory Visit: Payer: Self-pay

## 2021-05-31 VITALS — BP 135/81 | HR 100 | Temp 97.7°F | Ht 63.0 in | Wt 174.0 lb

## 2021-05-31 DIAGNOSIS — E559 Vitamin D deficiency, unspecified: Secondary | ICD-10-CM

## 2021-05-31 DIAGNOSIS — E782 Mixed hyperlipidemia: Secondary | ICD-10-CM | POA: Diagnosis not present

## 2021-05-31 DIAGNOSIS — E039 Hypothyroidism, unspecified: Secondary | ICD-10-CM

## 2021-05-31 DIAGNOSIS — I493 Ventricular premature depolarization: Secondary | ICD-10-CM | POA: Diagnosis not present

## 2021-05-31 NOTE — Assessment & Plan Note (Signed)
On Metoprolol 25 mg QD Palpitations better now

## 2021-05-31 NOTE — Patient Instructions (Signed)
Please continue taking medications as prescribed.  Please continue to follow plant-based diet and perform moderate exercise/walking at least 150 mins/week.

## 2021-05-31 NOTE — Assessment & Plan Note (Signed)
Last vitamin D Lab Results  Component Value Date   VD25OH 24.9 (L) 03/30/2021   Advised to take Vitamin D 2000 IU QD 

## 2021-05-31 NOTE — Progress Notes (Signed)
Established Patient Office Visit  Subjective:  Patient ID: Nicole Pollard, female    DOB: 1981-05-13  Age: 40 y.o. MRN: [id number]  CC:  Chief Complaint  Patient presents with   Hypothyroidism        Hyperlipidemia    HPI Nicole Pollard is a 40 year old female with PMH of hypothyroidism, abnormal PAP smear in the past (ASCUS), anxiety and diastasis recti s/p surgery who presents for follow up of hypothyroidism.  She has started taking levothyroxine for hypothyroidism.  She denies any recent weight gain since starting levothyroxine.  She has not noticed much changes in her hair loss, but states that her hair thinning is slowly improving.  She still has palpitations despite taking metoprolol for it, but it has improved now.  Her neck pain has resolved now.  She is following up with Dr. Halford Chessman for OSA, and is trying to get another sleep test for OSA.  Past Medical History:  Diagnosis Date   Abnormal Pap smear of cervix    12/15/14 - ASCUS + HPV   Anxiety 01/28/2015   Overview:  On zoloft 13m  EPDS 7 on 03/18/15 Mood stable  Last Assessment & Plan:  Pt states that her mood is stable on zoloft 560m  Anxiety 01/28/2015   Overview:  On zoloft 5063mEPDS 7 on 03/18/15 Mood stable  Last Assessment & Plan:  Pt states that her mood is stable on zoloft 37m57mormatting of this note might be different from the original. On zoloft 37mg67mDS 7 on 03/18/15 Mood stable  Overview:  Overview:  On zoloft 37mg 39mS 7 on 03/18/15 Mood stable  Last Assessment & Plan:  Pt states that her mood is stable on zoloft 37mg  71m Assessmen   Cellulitis of abdominal wall 09/01/2016   Hypothyroid    Medical history non-contributory    Short cervix affecting pregnancy 05/23/2015   [facility]    06/23/14 ASCUS + HPV    Past Surgical History:  Procedure Laterality Date   ABDOMINOPLASTY  06/28/2016   [place]   [place]     CESAREAN SECTION      TUBAL LIGATION      Family History  Problem Relation Age of Onset   Alcohol abuse Mother    Drug abuse Father    Hypertension Father    Anxiety disorder Maternal Aunt    Cancer Maternal Grandmother        Cervical    Social History   Socioeconomic History   Marital status: Married    Spouse name: Not on file   Number of children: Not on file   Years of education: Not on file   Highest education level: Not on file  Occupational History   Not on file  Tobacco Use   Smoking status: Never   Smokeless tobacco: Never  Substance and Sexual Activity   Alcohol use: Yes    Comment: Social   Drug use: No   Sexual activity: Yes    Birth control/protection: Surgical  Other Topics Concern   Not on file  Social History Narrative   Not on file   Social Determinants of Health   Financial Resource Strain: Low Risk    Difficulty of Paying Living Expenses: Not hard at all  Food Insecurity: No Food Insecurity   Worried About RunningEstate manager/land agentd in the Last Year: Never true   Ran Out of  Food in the Last Year: Never true  Transportation Needs: No Transportation Needs   Lack of Transportation (Medical): No   Lack of Transportation (Non-Medical): No  Physical Activity: Insufficiently Active   Days of Exercise per Week: 1 day   Minutes of Exercise per Session: 10 min  Stress: Stress Concern Present   Feeling of Stress : Rather much  Social Connections: Moderately Integrated   Frequency of Communication with Friends and Family: Twice a week   Frequency of Social Gatherings with Friends and Family: Once a week   Attends Religious Services: More than 4 times per year   Active Member of [employer] or Organizations: No   Attends Archivist Meetings: Never   Marital Status: Living with partner  Intimate Partner Violence: Not At Risk   Fear of Current or Ex-Partner: No   Emotionally Abused: No   Physically Abused: No   Sexually Abused: No    Outpatient Medications Prior to Visit   Medication Sig Dispense Refill   cholecalciferol (VITAMIN D3) 25 MCG (1000 UNIT) tablet Take 1,000 Units by mouth daily.     cyclobenzaprine (FLEXERIL) 10 MG tablet Take 1 tablet (10 mg total) by mouth 3 (three) times daily as needed for muscle spasms. 60 tablet 0   levothyroxine (SYNTHROID) 25 MCG tablet Take 1 tablet (25 mcg total) by mouth daily. 30 tablet 3   metoprolol succinate (TOPROL XL) 25 MG 24 hr tablet Take 1 tablet (25 mg total) by mouth daily. 90 tablet 3   No facility-administered medications prior to visit.    No Known Allergies  ROS Review of Systems  Constitutional:  Negative for chills and fever.  HENT:  Negative for congestion, sinus pressure, sinus pain and sore throat.   Eyes:  Negative for pain and discharge.  Respiratory:  Negative for cough and shortness of breath.   Cardiovascular:  Negative for chest pain and palpitations.  Gastrointestinal:  Positive for constipation. Negative for abdominal pain, diarrhea, nausea and vomiting.  Endocrine: Negative for polydipsia and polyuria.  Genitourinary:  Negative for dysuria and hematuria.  Musculoskeletal:  Negative for neck pain and neck stiffness.  Skin:  Negative for rash.  Neurological:  Negative for dizziness and weakness.  Psychiatric/Behavioral:  Negative for agitation and behavioral problems.      Objective:    Physical Exam Vitals reviewed.  Constitutional:      General: She is not in acute distress.    Appearance: She is not diaphoretic.  HENT:     Head: Normocephalic and atraumatic.     Nose: Nose normal.     Mouth/Throat:     Mouth: Mucous membranes are moist.  Eyes:     General: No scleral icterus.    Extraocular Movements: Extraocular movements intact.  Cardiovascular:     Rate and Rhythm: Normal rate and regular rhythm.     Pulses: Normal pulses.     Heart sounds: Normal heart sounds. No murmur heard. Pulmonary:     Breath sounds: Normal breath sounds. No wheezing or rales.  Abdominal:      Palpations: Abdomen is soft.     Tenderness: There is no abdominal tenderness.  Musculoskeletal:        General: Tenderness present.     Cervical back: Neck supple. No tenderness.     Right lower leg: No edema.     Left lower leg: No edema.  Skin:    General: Skin is warm.     Findings: No rash.  Neurological:  General: No focal deficit present.     Mental Status: She is alert and oriented to person, place, and time.     Sensory: No sensory deficit.     Motor: No weakness.  Psychiatric:        Mood and Affect: Mood normal.        Behavior: Behavior normal.    BP 135/81 (BP Location: Left Arm, Patient Position: Sitting, Cuff Size: Normal)   Pulse 100   Temp 97.7 F (36.5 C) (Oral)   Ht '5\' 3"'  (1.6 m)   Wt 174 lb (78.9 kg)   LMP 05/31/2021 (Exact Date)   SpO2 94%   BMI 30.82 kg/m  Wt Readings from Last 3 Encounters:  05/31/21 174 lb (78.9 kg)  05/24/21 173 lb 0.6 oz (78.5 kg)  04/04/21 168 lb 6.4 oz (76.4 kg)     Health Maintenance Due  Topic Date Due   COVID-19 Vaccine (1) Never done    There are no preventive care reminders to display for this patient.  Lab Results  Component Value Date   TSH 4.800 (H) 03/30/2021   Lab Results  Component Value Date   WBC 7.4 03/30/2021   HGB 13.8 03/30/2021   HCT 41.8 03/30/2021   MCV 95 03/30/2021   PLT 358 03/30/2021   Lab Results  Component Value Date   NA 139 03/30/2021   K 4.5 03/30/2021   CO2 23 03/30/2021   GLUCOSE 85 03/30/2021   BUN 8 03/30/2021   CREATININE 0.63 03/30/2021   BILITOT 0.3 03/30/2021   ALKPHOS 71 03/30/2021   AST 12 03/30/2021   ALT 9 03/30/2021   PROT 6.2 03/30/2021   ALBUMIN 4.2 03/30/2021   CALCIUM 8.8 03/30/2021   ANIONGAP 9 07/25/2017   EGFR 116 03/30/2021   Lab Results  Component Value Date   CHOL 201 (H) 03/30/2021   Lab Results  Component Value Date   HDL 64 03/30/2021   Lab Results  Component Value Date   LDLCALC 113 (H) 03/30/2021   Lab Results  Component  Value Date   TRIG 140 03/30/2021   Lab Results  Component Value Date   CHOLHDL 3.1 03/30/2021   Lab Results  Component Value Date   HGBA1C 5.1 03/30/2021      Assessment & Plan:   Problem List Items Addressed This Visit       Cardiovascular and Mediastinum   Frequent PVCs    On Metoprolol 25 mg QD Palpitations better now        Endocrine   Hypothyroidism - Primary    Lab Results  Component Value Date   TSH 4.800 (H) 03/30/2021  On Levothyroxine 25 mcg QD Had weight gain and hair loss before starting Levothyroxine Check TSH and free T4       Relevant Orders   TSH + free T4     Other   Vitamin D deficiency    Last vitamin D Lab Results  Component Value Date   VD25OH 24.9 (L) 03/30/2021  Advised to take Vitamin D 2000 IU QD      Mixed hyperlipidemia    Lipid profile reviewed Advised to continue plant-based, low cholesterol diet       No orders of the defined types were placed in this encounter.   Follow-up: Return in about 6 months (around 11/28/2021) for Hypothyroidism.    Lindell Spar, MD

## 2021-05-31 NOTE — Assessment & Plan Note (Signed)
Lipid profile reviewed Advised to continue plant-based, low cholesterol diet 

## 2021-05-31 NOTE — Assessment & Plan Note (Signed)
Lab Results  Component Value Date   TSH 4.800 (H) 03/30/2021   On Levothyroxine 25 mcg QD Had weight gain and hair loss before starting Levothyroxine Check TSH and free T4

## 2021-06-01 LAB — TSH+FREE T4
Free T4: 0.96 ng/dL (ref 0.82–1.77)
TSH: 2.55 u[IU]/mL (ref 0.450–4.500)

## 2021-07-21 ENCOUNTER — Ambulatory Visit: Payer: Medicaid Other | Admitting: Internal Medicine

## 2021-07-21 ENCOUNTER — Encounter: Payer: Self-pay | Admitting: Internal Medicine

## 2021-07-21 ENCOUNTER — Other Ambulatory Visit: Payer: Self-pay

## 2021-07-21 VITALS — BP 124/72 | HR 81 | Resp 16 | Ht 63.0 in | Wt 175.1 lb

## 2021-07-21 DIAGNOSIS — F429 Obsessive-compulsive disorder, unspecified: Secondary | ICD-10-CM

## 2021-07-21 MED ORDER — FLUOXETINE HCL 10 MG PO TABS: 10.0000 mg | ORAL_TABLET | Freq: Every day | ORAL | 2 refills | Status: DC

## 2021-07-21 NOTE — Assessment & Plan Note (Signed)
Intrusive thoughts and acts history since childhood Periods of repetitive activities Has episodes of paranoia at times Started Prozac for now Had lengthy discussion about OCD triggers and relaxation techniques - material provided Referred to Psychiatry

## 2021-07-21 NOTE — Patient Instructions (Signed)
Please start taking Prozac as discussed.  You are being referred to Psychiatry.

## 2021-07-21 NOTE — Progress Notes (Signed)
Acute Office Visit  Subjective:    Patient ID: Nicole Pollard, female    DOB: 07-30-1981, 40 y.o.   MRN: [id number]  Chief Complaint  Patient presents with   Referral    For Mental health    HPI Patient is in today for c/o depressed mood and anxiety, which is chronic. She reports having obsessive thoughts since her childhood about cleanliness and order. She has to check on things that she does multiple times in the fear of missing something and/or something going wrong. She thinks that she has spoiled her whole childhood and adolescence due to these thoughts. She states that her symptoms are better compared to prior, but now she realizes that she needs Behavioral health counseling. She gets very anxious at times due to intrusive thoughts and starts having medical complaints such as palpitations and dyspnea. She has not had BH therapy since her childhood. She currently denies any change in appetite or weight. Denies any SI or HI.  Past Medical History:  Diagnosis Date   Abnormal Pap smear of cervix    12/15/14 - ASCUS + HPV   Anxiety 01/28/2015   Overview:  On zoloft 50mg   EPDS 7 on 03/18/15 Mood stable  Last Assessment & Plan:  Pt states that her mood is stable on zoloft 50mg    Anxiety 01/28/2015   Overview:  On zoloft 50mg   EPDS 7 on 03/18/15 Mood stable  Last Assessment & Plan:  Pt states that her mood is stable on zoloft 50mg   Formatting of this note might be different from the original. On zoloft 50mg   EPDS 7 on 03/18/15 Mood stable  Overview:  Overview:  On zoloft 50mg   EPDS 7 on 03/18/15 Mood stable  Last Assessment & Plan:  Pt states that her mood is stable on zoloft 50mg   Last Assessmen   Cellulitis of abdominal wall 09/01/2016   Hypothyroid    Medical history non-contributory    Short cervix affecting pregnancy 05/23/2015   [facility]age [facility]    Vaginal Pap smear, abnormal    06/23/14 ASCUS + HPV    Past Surgical History:  Procedure Laterality Date    ABDOMINOPLASTY  06/28/2016   [place],   APPENDECTOMY     CESAREAN SECTION     TUBAL LIGATION      Family History  Problem Relation Age of Onset   Alcohol abuse Mother    Drug abuse Father    Hypertension Father    Anxiety disorder Maternal Aunt    Cancer Maternal Grandmother        Cervical    Social History   Socioeconomic History   Marital status: Married    Spouse name: Not on file   Number of children: Not on file   Years of education: Not on file   Highest education level: Not on file  Occupational History   Not on file  Tobacco Use   Smoking status: Never   Smokeless tobacco: Never  Substance and Sexual Activity   Alcohol use: Yes    Comment: Social   Drug use: No   Sexual activity: Yes    Birth control/protection: Surgical  Other Topics Concern   Not on file  Social History Narrative   Not on file   Social Determinants of Health   Financial Resource Strain: Low Risk    Difficulty of Paying Living Expenses: Not hard at all  Food Insecurity: No Food Insecurity   Worried About 10/30/2016 of Food  in the Last Year: Never true   Ran Out of Food in the Last Year: Never true  Transportation Needs: No Transportation Needs   Lack of Transportation (Medical): No   Lack of Transportation (Non-Medical): No  Physical Activity: Insufficiently Active   Days of Exercise per Week: 1 day   Minutes of Exercise per Session: 10 min  Stress: Stress Concern Present   Feeling of Stress : Rather much  Social Connections: Moderately Integrated   Frequency of Communication with Friends and Family: Twice a week   Frequency of Social Gatherings with Friends and Family: Once a week   Attends Religious Services: More than 4 times per year   Active Member of [employer] or Organizations: No   Attends Banker Meetings: Never   Marital Status: Living with partner  Intimate Partner Violence: Not At Risk   Fear of Current or Ex-Partner: No   Emotionally Abused: No    Physically Abused: No   Sexually Abused: No    Outpatient Medications Prior to Visit  Medication Sig Dispense Refill   cholecalciferol (VITAMIN D3) 25 MCG (1000 UNIT) tablet Take 1,000 Units by mouth daily.     cyclobenzaprine (FLEXERIL) 10 MG tablet Take 1 tablet (10 mg total) by mouth 3 (three) times daily as needed for muscle spasms. 60 tablet 0   levothyroxine (SYNTHROID) 25 MCG tablet Take 1 tablet (25 mcg total) by mouth daily. 30 tablet 3   metoprolol succinate (TOPROL XL) 25 MG 24 hr tablet Take 1 tablet (25 mg total) by mouth daily. 90 tablet 3   No facility-administered medications prior to visit.    No Known Allergies  Review of Systems  Constitutional:  Negative for chills and fever.  HENT:  Negative for congestion, sinus pressure, sinus pain and sore throat.   Eyes:  Negative for pain and discharge.  Respiratory:  Negative for cough and shortness of breath.   Cardiovascular:  Negative for chest pain and palpitations.  Gastrointestinal:  Positive for constipation. Negative for abdominal pain, diarrhea, nausea and vomiting.  Endocrine: Negative for polydipsia and polyuria.  Genitourinary:  Negative for dysuria and hematuria.  Musculoskeletal:  Negative for neck pain and neck stiffness.  Skin:  Negative for rash.  Neurological:  Negative for dizziness and weakness.  Psychiatric/Behavioral:  Positive for decreased concentration and dysphoric mood. Negative for agitation, behavioral problems and suicidal ideas. The patient is nervous/anxious.       Objective:    Physical Exam Vitals reviewed.  Constitutional:      General: She is not in acute distress.    Appearance: She is not diaphoretic.  HENT:     Head: Normocephalic and atraumatic.     Nose: Nose normal.     Mouth/Throat:     Mouth: Mucous membranes are moist.  Eyes:     General: No scleral icterus.    Extraocular Movements: Extraocular movements intact.  Cardiovascular:     Rate and Rhythm: Normal rate and  regular rhythm.     Pulses: Normal pulses.     Heart sounds: Normal heart sounds. No murmur heard. Pulmonary:     Breath sounds: Normal breath sounds. No wheezing or rales.  Abdominal:     Palpations: Abdomen is soft.     Tenderness: There is no abdominal tenderness.  Musculoskeletal:     Cervical back: Neck supple. No tenderness.     Right lower leg: No edema.     Left lower leg: No edema.  Skin:  General: Skin is warm.     Findings: No rash.  Neurological:     General: No focal deficit present.     Mental Status: She is alert and oriented to person, place, and time.     Sensory: No sensory deficit.     Motor: No weakness.  Psychiatric:        Mood and Affect: Mood is anxious and depressed.        Behavior: Behavior normal.    BP 124/72    Pulse 81    Resp 16    Ht 5\' 3"  (1.6 m)    Wt 175 lb 1.3 oz (79.4 kg)    SpO2 98%    BMI 31.01 kg/m  Wt Readings from Last 3 Encounters:  07/21/21 175 lb 1.3 oz (79.4 kg)  05/31/21 174 lb (78.9 kg)  05/24/21 173 lb 0.6 oz (78.5 kg)        Assessment & Plan:   Problem List Items Addressed This Visit       Other   Obsessive-compulsive disorder - Primary    Intrusive thoughts and acts history since childhood Periods of repetitive activities Has episodes of paranoia at times Started Prozac for now Had lengthy discussion about OCD triggers and relaxation techniques - material provided Referred to Psychiatry      Relevant Medications   FLUoxetine (PROZAC) 10 MG tablet   Other Relevant Orders   Ambulatory referral to Psychiatry     Meds ordered this encounter  Medications   FLUoxetine (PROZAC) 10 MG tablet    Sig: Take 1 tablet (10 mg total) by mouth daily.    Dispense:  30 tablet    Refill:  2     Antwion Carpenter Keith Rake, MD

## 2021-07-22 ENCOUNTER — Telehealth: Payer: Self-pay | Admitting: Internal Medicine

## 2021-07-22 NOTE — Telephone Encounter (Signed)
Pt needs authorization on med for insurance coverage   FLUoxetine (PROZAC) 10 MG tablet

## 2021-07-22 NOTE — Telephone Encounter (Signed)
Please have her call the pharmacy to start the prior auth I have not received any forms on this

## 2021-07-27 ENCOUNTER — Ambulatory Visit: Payer: Medicaid Other | Admitting: Internal Medicine

## 2021-07-27 ENCOUNTER — Other Ambulatory Visit: Payer: Self-pay

## 2021-07-27 ENCOUNTER — Ambulatory Visit (INDEPENDENT_AMBULATORY_CARE_PROVIDER_SITE_OTHER): Payer: Medicaid Other | Admitting: Licensed Clinical Social Worker

## 2021-07-27 DIAGNOSIS — F429 Obsessive-compulsive disorder, unspecified: Secondary | ICD-10-CM

## 2021-08-03 NOTE — BH Specialist Note (Signed)
[facility] Follow Up Assessment  MRN: [id number] NAME: Gayna Braddy Date: 08/03/21  Start time: 930 End time: 940 Total time:   Type of Contact: Follow up Call  Current concerns/stressors: OCD symptoms     Functional Assessment:  Sleep: fair Appetite: fair Coping ability: overwhelmed Patient taking medications as prescribed:    Current medications:  Outpatient Encounter Medications as of 07/27/2021  Medication Sig   cholecalciferol (VITAMIN D3) 25 MCG (1000 UNIT) tablet Take 1,000 Units by mouth daily.   cyclobenzaprine (FLEXERIL) 10 MG tablet Take 1 tablet (10 mg total) by mouth 3 (three) times daily as needed for muscle spasms.   FLUoxetine (PROZAC) 10 MG tablet Take 1 tablet (10 mg total) by mouth daily.   levothyroxine (SYNTHROID) 25 MCG tablet Take 1 tablet (25 mcg total) by mouth daily.   metoprolol succinate (TOPROL XL) 25 MG 24 hr tablet Take 1 tablet (25 mg total) by mouth daily.   No facility-administered encounter medications on file as of 07/27/2021.    Self-harm and/or Suicidal Behaviors Risk Assessment Self-harm risk factors: no Patient endorses recent self injurious thoughts and/or behaviors: No   Suicide ideations: No plan to harm self or others   Danger to Others Risk Assessment Danger to others risk factors: no Patient endorses recent thoughts of harming others: No    Substance Use Assessment Patient recently consumed alcohol: No  Patient recently used drugs: No  Patient is concerned about dependence or abuse of substances: No    Goals, Interventions and Follow-up Plan Goals: Increase healthy adjustment to current life circumstances Interventions: Mindfulness or Relaxation Training, Behavioral Activation, and CBT Cognitive Behavioral Therapy   Summary of Clinical Assessment  Shalese is a 41 yr old who requested a referral to Psychiatry.  She reports that she is on Prozac for OCD.  She reports having rituals and periods of  overthinking.  She states that a lot of her "stuff comes from childhood."    Follow-up Plan:  [facility] follow up Marinda Elk, LCSW

## 2021-08-03 NOTE — Progress Notes (Signed)
Bh specialist note °

## 2021-08-17 ENCOUNTER — Other Ambulatory Visit: Payer: Self-pay | Admitting: Internal Medicine

## 2021-08-17 ENCOUNTER — Encounter: Payer: Self-pay | Admitting: Internal Medicine

## 2021-08-17 DIAGNOSIS — E039 Hypothyroidism, unspecified: Secondary | ICD-10-CM

## 2021-08-18 ENCOUNTER — Telehealth: Payer: Self-pay | Admitting: Internal Medicine

## 2021-08-18 DIAGNOSIS — I493 Ventricular premature depolarization: Secondary | ICD-10-CM

## 2021-08-18 DIAGNOSIS — R002 Palpitations: Secondary | ICD-10-CM

## 2021-08-18 NOTE — Telephone Encounter (Signed)
Pt sent in a strip that  her watch interpretted as afib     I did not think it was afib  Told her she could be set up for a 2 wk Zio patch to see if having afib  Please call to see if she wants it

## 2021-08-18 NOTE — Addendum Note (Signed)
Addended by: Stephani Police on: 08/18/2021 05:45 PM   Modules accepted: Orders

## 2021-08-18 NOTE — Telephone Encounter (Signed)
Spoke with the pt and she agrees to 14 day Zio for her Palpitations.

## 2021-08-19 ENCOUNTER — Ambulatory Visit (INDEPENDENT_AMBULATORY_CARE_PROVIDER_SITE_OTHER): Payer: BC Managed Care – PPO

## 2021-08-19 DIAGNOSIS — I493 Ventricular premature depolarization: Secondary | ICD-10-CM

## 2021-08-19 DIAGNOSIS — R002 Palpitations: Secondary | ICD-10-CM

## 2021-08-19 NOTE — Progress Notes (Unsigned)
Enrolled for [employer] to mail a ZIO XT long term holter monitor to the patients address on file.  

## 2021-08-21 DIAGNOSIS — R002 Palpitations: Secondary | ICD-10-CM | POA: Diagnosis not present

## 2021-08-21 DIAGNOSIS — I493 Ventricular premature depolarization: Secondary | ICD-10-CM | POA: Diagnosis not present

## 2021-09-02 ENCOUNTER — Encounter: Payer: Self-pay | Admitting: Pulmonary Disease

## 2021-09-16 ENCOUNTER — Ambulatory Visit: Payer: BC Managed Care – PPO

## 2021-09-16 ENCOUNTER — Other Ambulatory Visit: Payer: Self-pay

## 2021-09-16 DIAGNOSIS — G4733 Obstructive sleep apnea (adult) (pediatric): Secondary | ICD-10-CM | POA: Diagnosis not present

## 2021-09-16 DIAGNOSIS — R0683 Snoring: Secondary | ICD-10-CM

## 2021-09-16 DIAGNOSIS — R635 Abnormal weight gain: Secondary | ICD-10-CM

## 2021-09-16 DIAGNOSIS — E039 Hypothyroidism, unspecified: Secondary | ICD-10-CM

## 2021-09-22 ENCOUNTER — Ambulatory Visit (INDEPENDENT_AMBULATORY_CARE_PROVIDER_SITE_OTHER): Payer: BC Managed Care – PPO

## 2021-09-22 ENCOUNTER — Other Ambulatory Visit: Payer: Self-pay

## 2021-09-22 ENCOUNTER — Ambulatory Visit: Payer: BC Managed Care – PPO | Admitting: Internal Medicine

## 2021-09-22 DIAGNOSIS — A184 Tuberculosis of skin and subcutaneous tissue: Secondary | ICD-10-CM | POA: Diagnosis not present

## 2021-09-23 ENCOUNTER — Telehealth: Payer: Self-pay | Admitting: Pulmonary Disease

## 2021-09-23 DIAGNOSIS — G4733 Obstructive sleep apnea (adult) (pediatric): Secondary | ICD-10-CM | POA: Diagnosis not present

## 2021-09-23 NOTE — Telephone Encounter (Signed)
Called and gave results to patient. She voiced understanding. Scheduled her for an appt in [place].  Nothing further needed.

## 2021-09-23 NOTE — Telephone Encounter (Signed)
HST 09/16/21 >> AHI 6, SpO2 low 85%   Please inform her that her sleep study shows mild obstructive sleep apnea.  Please arrange for ROV with me or NP to discuss treatment options.

## 2021-09-24 ENCOUNTER — Encounter: Payer: Self-pay | Admitting: Internal Medicine

## 2021-09-24 LAB — TB SKIN TEST
Induration: 0 mm
TB Skin Test: NEGATIVE

## 2021-09-27 ENCOUNTER — Ambulatory Visit: Payer: BC Managed Care – PPO | Admitting: Primary Care

## 2021-09-27 ENCOUNTER — Telehealth: Payer: Self-pay

## 2021-09-27 NOTE — Telephone Encounter (Signed)
Health Exam Certificate   Copied Noted Sleeved  

## 2021-10-01 ENCOUNTER — Encounter: Payer: Self-pay | Admitting: Adult Health

## 2021-10-01 ENCOUNTER — Ambulatory Visit (INDEPENDENT_AMBULATORY_CARE_PROVIDER_SITE_OTHER): Payer: BC Managed Care – PPO | Admitting: Adult Health

## 2021-10-01 VITALS — BP 122/80 | HR 80 | Temp 98.4°F | Ht 63.0 in | Wt 174.4 lb

## 2021-10-01 DIAGNOSIS — Z0279 Encounter for issue of other medical certificate: Secondary | ICD-10-CM

## 2021-10-01 DIAGNOSIS — E663 Overweight: Secondary | ICD-10-CM

## 2021-10-01 DIAGNOSIS — G4733 Obstructive sleep apnea (adult) (pediatric): Secondary | ICD-10-CM

## 2021-10-01 DIAGNOSIS — R0683 Snoring: Secondary | ICD-10-CM | POA: Diagnosis not present

## 2021-10-01 NOTE — Progress Notes (Signed)
? ?@Patient  ID: , female    DOB: 1981-06-27, 41 y.o.   MRN: [id number] ? ?Chief Complaint  ?Patient presents with  ? Follow-up  ? ? ?Referring provider: ?[id number], MD ? ?HPI: ?41 year old female seen for sleep consult May 24, 2021 for daytime sleepiness and snoring found to have mild sleep apnea on home sleep study ? ? ?TEST/EVENTS :  ?HST 09/16/21 >> AHI 6, SpO2 low 85% ? ?10/01/2021 Follow up: OSA  ?Patient returns for a follow-up visit.  She was seen May 24, 2021 for daytime sleepiness and snoring.  Patient complains that she has low energy fatigue and restless sleep.  Patient was set up for home sleep study that was completed on September 16, 2021 that showed mild sleep apnea with AHI at 6/hour with SPO2 low at 85%.  We discussed her sleep study results.  Went over treatment options including weight loss, oral appliance and CPAP.  We also discussed positional sleep.  We talked a long time regarding a oral appliance however patient wants to proceed with a CPAP. ?She says that she has a lot of daytime symptoms.  ? ?No Known Allergies ? ?Immunization History  ?Administered Date(s) Administered  ? Influenza,inj,Quad PF,6+ Mos 06/23/2014, 09/02/2019, 06/16/2020, 03/29/2021  ? PPD Test 09/22/2021  ? Tdap 10/03/2014, 04/29/2020  ? ? ?Past Medical History:  ?Diagnosis Date  ? Abnormal Pap smear of cervix   ? 12/15/14 - ASCUS + HPV  ? Anxiety 01/28/2015  ? Overview:  On zoloft 50mg   EPDS 7 on 03/18/15 Mood stable  Last Assessment & Plan:  Pt states that her mood is stable on zoloft 50mg   ? Anxiety 01/28/2015  ? Overview:  On zoloft 50mg   EPDS 7 on 03/18/15 Mood stable  Last Assessment & Plan:  Pt states that her mood is stable on zoloft 50mg   Formatting of this note might be different from the original. On zoloft 50mg   EPDS 7 on 03/18/15 Mood stable  Overview:  Overview:  On zoloft 50mg   EPDS 7 on 03/18/15 Mood stable  Last Assessment & Plan:  Pt states that her mood is stable on zoloft 50mg   Last  Assessmen  ? Cellulitis of abdominal wall 09/01/2016  ? Hypothyroid   ? Medical history non-contributory   ? Short cervix affecting pregnancy 05/23/2015  ? Cerclage placed 04/03/15 at [facility]   ? Vaginal Pap smear, abnormal   ? 06/23/14 ASCUS + HPV  ? ? ?Tobacco History: ?Social History  ? ?Tobacco Use  ?Smoking Status Never  ?Smokeless Tobacco Never  ? ?Counseling given: Not Answered ? ? ?Outpatient Medications Prior to Visit  ?Medication Sig Dispense Refill  ? cholecalciferol (VITAMIN D3) 25 MCG (1000 UNIT) tablet Take 1,000 Units by mouth daily.    ? FLUoxetine (PROZAC) 10 MG tablet Take 1 tablet (10 mg total) by mouth daily. 30 tablet 2  ? levothyroxine (SYNTHROID) 25 MCG tablet TAKE 1 TABLET(25 MCG) BY MOUTH DAILY 30 tablet 3  ? Magnesium 400 MG CAPS Take 400 mg by mouth daily. Taking for PVCs    ? cyclobenzaprine (FLEXERIL) 10 MG tablet Take 1 tablet (10 mg total) by mouth 3 (three) times daily as needed for muscle spasms. (Patient not taking: Reported on 10/01/2021) 60 tablet 0  ? metoprolol succinate (TOPROL XL) 25 MG 24 hr tablet Take 1 tablet (25 mg total) by mouth daily. (Patient not taking: Reported on 10/01/2021) 90 tablet 3  ? ?No facility-administered medications prior to visit.  ? ? ? ?  Review of Systems:  ? ?Constitutional:   No  weight loss, night sweats,  Fevers, chills,  ?+fatigue, or  lassitude. ? ?HEENT:   No headaches,  Difficulty swallowing,  Tooth/dental problems, or  Sore throat,  ?              No sneezing, itching, ear ache, nasal congestion, post nasal drip,  ? ?CV:  No chest pain,  Orthopnea, PND, swelling in lower extremities, anasarca, dizziness, palpitations, syncope.  ? ?GI  No heartburn, indigestion, abdominal pain, nausea, vomiting, diarrhea, change in bowel habits, loss of appetite, bloody stools.  ? ?Resp: No shortness of breath with exertion or at rest.  No excess mucus, no productive cough,  No non-productive cough,  No coughing up of blood.  No change in color of mucus.  No  wheezing.  No chest wall deformity ? ?Skin: no rash or lesions. ? ?GU: no dysuria, change in color of urine, no urgency or frequency.  No flank pain, no hematuria  ? ?MS:  No joint pain or swelling.  No decreased range of motion.  No back pain. ? ? ? ?Physical Exam ? ?BP 122/80 (BP Location: Left Arm, Patient Position: Sitting, Cuff Size: Normal)   Pulse 80   Temp 98.4 ?F (36.9 ?C) (Oral)   Ht 5\' 3"  (1.6 m)   Wt 174 lb 6.4 oz (79.1 kg)   SpO2 97%   BMI 30.89 kg/m?  ? ?GEN: A/Ox3; pleasant , NAD, well nourished  ?  ?HEENT:  [place]/AT,   NOSE-clear, THROAT-clear, no lesions, no postnasal drip or exudate noted.  ?Class II-III MP airway ? ?NECK:  Supple w/ fair ROM; no JVD; normal carotid impulses w/o bruits; no thyromegaly or nodules palpated; no lymphadenopathy.   ? ?RESP  Clear  P & A; w/o, wheezes/ rales/ or rhonchi. no accessory muscle use, no dullness to percussion ? ?CARD:  RRR, no m/r/g, no peripheral edema, pulses intact, no cyanosis or clubbing. ? ?GI:   Soft & nt; nml bowel sounds; no organomegaly or masses detected.  ? ?Musco: Warm bil, no deformities or joint swelling noted.  ? ?Neuro: alert, no focal deficits noted.   ? ?Skin: Warm, no lesions or rashes ? ? ? ?Lab Results: ? ? ? ? ?BNP ?No results found for: BNP ? ?ProBNP ?No results found for: PROBNP ? ?Imaging: ? ? ? ? ?No flowsheet data found. ? ?No results found for: NITRICOXIDE ? ? ? ? ? ?Assessment & Plan:  ? ?OSA (obstructive sleep apnea) ?Mild obstructive sleep apnea.  Patient education was given.  We reviewed her sleep study results which showed mild sleep apnea.  Went over treatment options including oral appliance weight loss positional sleep and CPAP.  Patient prefers to begin with CPAP. ?Patient education on CPAP ?She wants to go with ?- discussed how weight can impact sleep and risk for sleep disordered breathing ?- discussed options to assist with weight loss: combination of diet modification, cardiovascular and  strength training exercises ?  ?- had an extensive discussion regarding the adverse health consequences related to untreated sleep disordered breathing ?- specifically discussed the risks for hypertension, coronary artery disease, cardiac dysrhythmias, cerebrovascular disease, and diabetes ?- lifestyle modification discussed ?  ?- discussed how sleep disruption can increase risk of accidents, particularly when driving ?- safe driving practices were discussed ?  ?Will begin CPAP AutoSet 5 to 15 cm H2O.  Mask of choice enroll in [facility] ? ?Plan  ?Patient Instructions  ?Begin  CPAP At bedtime   ?Wear all night long for at least 6hr each night  ?Work on healthy weight loss.  ?Do not drive if sleepy.  ?Follow up with 3 months with Dr. Craige Cotta  or Dennys Traughber NP .  ?  ? ? ?Overweight ?Healthy weight loss discussed ? ? ? ?Rubye Oaks, NP ?10/01/2021 ? ?

## 2021-10-01 NOTE — Patient Instructions (Addendum)
Begin CPAP At bedtime   ?Wear all night long for at least 6hr each night  ?Work on healthy weight loss.  ?Do not drive if sleepy.  ?Follow up with 3 months with Dr. Craige Cotta  or Rigoberto Repass NP .  ?

## 2021-10-01 NOTE — Assessment & Plan Note (Signed)
Healthy weight loss discussed 

## 2021-10-01 NOTE — Assessment & Plan Note (Signed)
Mild obstructive sleep apnea.  Patient education was given.  We reviewed her sleep study results which showed mild sleep apnea.  Went over treatment options including oral appliance weight loss positional sleep and CPAP.  Patient prefers to begin with CPAP. ?Patient education on CPAP ?She wants to go with [employer] ?- discussed how weight can impact sleep and risk for sleep disordered breathing ?- discussed options to assist with weight loss: combination of diet modification, cardiovascular and strength training exercises ?  ?- had an extensive discussion regarding the adverse health consequences related to untreated sleep disordered breathing ?- specifically discussed the risks for hypertension, coronary artery disease, cardiac dysrhythmias, cerebrovascular disease, and diabetes ?- lifestyle modification discussed ?  ?- discussed how sleep disruption can increase risk of accidents, particularly when driving ?- safe driving practices were discussed ?  ?Will begin CPAP AutoSet 5 to 15 cm H2O.  Mask of choice enroll in [facility] ? ?Plan  ?Patient Instructions  ?Begin CPAP At bedtime   ?Wear all night long for at least 6hr each night  ?Work on healthy weight loss.  ?Do not drive if sleepy.  ?Follow up with 3 months with Dr. Craige Cotta  or Deanglo Hissong NP .  ?  ? ?

## 2021-10-04 NOTE — Telephone Encounter (Signed)
Called patient and left a vociemail that forms are ready for pickup.   ?

## 2021-10-11 ENCOUNTER — Other Ambulatory Visit: Payer: Self-pay

## 2021-10-11 ENCOUNTER — Telehealth: Payer: Self-pay

## 2021-10-11 DIAGNOSIS — F429 Obsessive-compulsive disorder, unspecified: Secondary | ICD-10-CM

## 2021-10-11 MED ORDER — FLUOXETINE HCL 10 MG PO TABS: 10.0000 mg | ORAL_TABLET | Freq: Every day | ORAL | 2 refills | Status: DC

## 2021-10-11 NOTE — Telephone Encounter (Signed)
Patient need med refill ?FLUoxetine (PROZAC) 10 MG tablet  ? ?Pharmacy: [facility] ?

## 2021-10-11 NOTE — Telephone Encounter (Signed)
Refills sent

## 2021-10-19 ENCOUNTER — Ambulatory Visit: Payer: Medicaid Other | Admitting: Internal Medicine

## 2021-10-25 ENCOUNTER — Ambulatory Visit: Payer: Medicaid Other | Admitting: Internal Medicine

## 2021-11-11 ENCOUNTER — Ambulatory Visit: Payer: BC Managed Care – PPO | Admitting: Internal Medicine

## 2021-11-11 ENCOUNTER — Encounter: Payer: Self-pay | Admitting: Internal Medicine

## 2021-11-11 VITALS — BP 129/84 | HR 94 | Ht 63.0 in | Wt 178.0 lb

## 2021-11-11 DIAGNOSIS — F429 Obsessive-compulsive disorder, unspecified: Secondary | ICD-10-CM | POA: Diagnosis not present

## 2021-11-11 DIAGNOSIS — E039 Hypothyroidism, unspecified: Secondary | ICD-10-CM | POA: Diagnosis not present

## 2021-11-11 DIAGNOSIS — G4733 Obstructive sleep apnea (adult) (pediatric): Secondary | ICD-10-CM

## 2021-11-11 DIAGNOSIS — L700 Acne vulgaris: Secondary | ICD-10-CM | POA: Diagnosis not present

## 2021-11-11 MED ORDER — TRETINOIN (FACIAL WRINKLES) 0.02 % EX CREA: 1.0000 "application " | TOPICAL_CREAM | Freq: Every day | CUTANEOUS | 0 refills | Status: DC

## 2021-11-11 MED ORDER — FLUOXETINE HCL 20 MG PO TABS: 20.0000 mg | ORAL_TABLET | Freq: Every day | ORAL | 2 refills | Status: DC

## 2021-11-11 NOTE — Assessment & Plan Note (Signed)
Lab Results  Component Value Date   TSH 2.550 05/31/2021   On Levothyroxine 25 mcg QD Had weight gain and hair loss before starting Levothyroxine Check TSH and free T4 

## 2021-11-11 NOTE — Progress Notes (Addendum)
? ?Established Patient Office Visit ? ?Subjective:  ?Patient ID: Nicole Pollard, female    DOB: [birth year]  Age: 41 y.o. MRN: [id number] ? ?CC:  ?Chief Complaint  ?Patient presents with  ? Follow-up  ?  3 month follow up ringing in ears sometimes worse than others,likes prozac thinks maybe needs a dose change sometimes still has depression, weight   ? ? ?HPI ?Nicole Pollard is a 40 y.o. female with past medical history of hypothyroidism, abnormal PAP smear in the past (ASCUS), OCD, PTSD and diastasis recti s/p surgery who presents for f/u of her chronic medical conditions. ? ?OCD and PTSD: She has been feeling better with Prozac now, but still has episodes of anxiety and anhedonia at times.  She currently denies any SI or HI.  She is able to focus better with Prozac now. ? ?She is concerned about her weight gain since starting Prozac but prefers to continue it for now as it is helping her with OCD and PTSD.  She agrees to follow low-carb diet for now.  She has been walking regularly. ? ?She has stopped taking metoprolol, and states that her palpitations have improved now.  She denies any chest pain, dyspnea or dizziness currently. ? ?She c/o acne and wrinkles over the face, and asks for any cream to help with it.  She has had cosmetic procedure for wrinkles in the past. ? ?She has received her CPAP device now and has started using it now. ? ?Past Medical History:  ?Diagnosis Date  ? Abnormal Pap smear of cervix   ? 12/15/14 - ASCUS + HPV  ? Anxiety 01/28/2015  ? Overview:  On zoloft 50mg   EPDS 7 on 03/18/15 Mood stable  Last Assessment & Plan:  Pt states that her mood is stable on zoloft 50mg   ? Anxiety 01/28/2015  ? Overview:  On zoloft 50mg   EPDS 7 on 03/18/15 Mood stable  Last Assessment & Plan:  Pt states that her mood is stable on zoloft 50mg   Formatting of this note might be different from the original. On zoloft 50mg   EPDS 7 on 03/18/15 Mood stable  Overview:  Overview:  On zoloft 50mg   EPDS 7 on 03/18/15 Mood stable   Last Assessment & Plan:  Pt states that her mood is stable on zoloft 50mg   Last Assessmen  ? Cellulitis of abdominal wall 09/01/2016  ? Hypothyroid   ? Medical history non-contributory   ? Short cervix affecting pregnancy 05/23/2015  ? Cerclage [facility] 04/03/15 at [facility]   ? Vaginal Pap smear, abnormal   ? 06/23/14 ASCUS + HPV  ? ? ?Past Surgical History:  ?Procedure Laterality Date  ? ABDOMINOPLASTY  06/28/2016  ? [place], 10/30/2016  ? APPENDECTOMY    ? CESAREAN SECTION    ? TUBAL LIGATION    ? ? ?Family History  ?Problem Relation Age of Onset  ? Alcohol abuse Mother   ? Drug abuse Father   ? Hypertension Father   ? Anxiety disorder Maternal Aunt   ? Cancer Maternal Grandmother   ?     Cervical  ? ? ?Social History  ? ?Socioeconomic History  ? Marital status: Divorced  ?  Spouse name: Not on file  ? Number of children: Not on file  ? Years of education: Not on file  ? Highest education level: Not on file  ?Occupational History  ? Not on file  ?Tobacco Use  ? Smoking status: Never  ? Smokeless tobacco: Never  ?Substance and Sexual Activity  ?  Alcohol use: Yes  ?  Comment: Social  ? Drug use: No  ? Sexual activity: Yes  ?  Birth control/protection: Surgical  ?Other Topics Concern  ? Not on file  ?Social History Narrative  ? Not on file  ? ?Social Determinants of Health  ? ?Financial Resource Strain: Low Risk   ? Difficulty of Paying Living Expenses: Not hard at all  ?Food Insecurity: No Food Insecurity  ? Worried About Programme researcher, broadcasting/film/video in the Last Year: Never true  ? Ran Out of Food in the Last Year: Never true  ?Transportation Needs: No Transportation Needs  ? Lack of Transportation (Medical): No  ? Lack of Transportation (Non-Medical): No  ?Physical Activity: Insufficiently Active  ? Days of Exercise per Week: 1 day  ? Minutes of Exercise per Session: 10 min  ?Stress: Stress Concern Present  ? Feeling of Stress : Rather much  ?Social Connections: Moderately Integrated  ? Frequency of Communication with Friends and  Family: Twice a week  ? Frequency of Social Gatherings with Friends and Family: Once a week  ? Attends Religious Services: More than 4 times per year  ? Active Member of Clubs or Organizations: No  ? Attends Banker Meetings: Never  ? Marital Status: Living with partner  ?Intimate Partner Violence: Not At Risk  ? Fear of Current or Ex-Partner: No  ? Emotionally Abused: No  ? Physically Abused: No  ? Sexually Abused: No  ? ? ?Outpatient Medications Prior to Visit  ?Medication Sig Dispense Refill  ? cholecalciferol (VITAMIN D3) 25 MCG (1000 UNIT) tablet Take 1,000 Units by mouth daily.    ? levothyroxine (SYNTHROID) 25 MCG tablet TAKE 1 TABLET(25 MCG) BY MOUTH DAILY 30 tablet 3  ? Magnesium 400 MG CAPS Take 400 mg by mouth daily. Taking for PVCs    ? FLUoxetine (PROZAC) 10 MG tablet Take 1 tablet (10 mg total) by mouth daily. 30 tablet 2  ? metoprolol succinate (TOPROL XL) 25 MG 24 hr tablet Take 1 tablet (25 mg total) by mouth daily. (Patient not taking: Reported on 10/01/2021) 90 tablet 3  ? cyclobenzaprine (FLEXERIL) 10 MG tablet Take 1 tablet (10 mg total) by mouth 3 (three) times daily as needed for muscle spasms. (Patient not taking: Reported on 10/01/2021) 60 tablet 0  ? ?No facility-administered medications prior to visit.  ? ? ?No Known Allergies ? ?ROS ?Review of Systems  ?Constitutional:  Negative for chills and fever.  ?HENT:  Negative for congestion, sinus pressure, sinus pain and sore throat.   ?Eyes:  Negative for pain and discharge.  ?Respiratory:  Negative for cough and shortness of breath.   ?Cardiovascular:  Negative for chest pain and palpitations.  ?Gastrointestinal:  Positive for constipation. Negative for abdominal pain, diarrhea, nausea and vomiting.  ?Endocrine: Negative for polydipsia and polyuria.  ?Genitourinary:  Negative for dysuria and hematuria.  ?Musculoskeletal:  Negative for neck pain and neck stiffness.  ?Skin:   ?     Acne, facial wrinkles  ?Neurological:  Negative for  dizziness and weakness.  ?Psychiatric/Behavioral:  Positive for dysphoric mood. Negative for agitation, behavioral problems and suicidal ideas. The patient is nervous/anxious.   ? ?  ?Objective:  ?  ?Physical Exam ?Vitals reviewed.  ?Constitutional:   ?   General: She is not in acute distress. ?   Appearance: She is obese. She is not diaphoretic.  ?HENT:  ?   Head: Normocephalic and atraumatic.  ?   Nose: Nose normal.  ?  Mouth/Throat:  ?   Mouth: Mucous membranes are moist.  ?Eyes:  ?   General: No scleral icterus. ?   Extraocular Movements: Extraocular movements intact.  ?Cardiovascular:  ?   Rate and Rhythm: Normal rate and regular rhythm.  ?   Pulses: Normal pulses.  ?   Heart sounds: Normal heart sounds. No murmur heard. ?Pulmonary:  ?   Breath sounds: Normal breath sounds. No wheezing or rales.  ?Abdominal:  ?   Palpations: Abdomen is soft.  ?   Tenderness: There is no abdominal tenderness.  ?Musculoskeletal:  ?   Cervical back: Neck supple. No tenderness.  ?   Right lower leg: No edema.  ?   Left lower leg: No edema.  ?Skin: ?   General: Skin is warm.  ?   Findings: Rash (Acneform - over face) present.  ?Neurological:  ?   General: No focal deficit present.  ?   Mental Status: She is alert and oriented to person, place, and time.  ?   Sensory: No sensory deficit.  ?   Motor: No weakness.  ?Psychiatric:     ?   Mood and Affect: Mood is anxious and depressed.     ?   Behavior: Behavior normal.  ? ? ?BP 129/84   Pulse 94   Ht 5\' 3"  (1.6 m)   Wt 178 lb (80.7 kg)   LMP 11/07/2021 (Approximate)   SpO2 96%   BMI 31.53 kg/m?  ?Wt Readings from Last 3 Encounters:  ?11/11/21 178 lb (80.7 kg)  ?10/01/21 174 lb 6.4 oz (79.1 kg)  ?07/21/21 175 lb 1.3 oz (79.4 kg)  ? ? ?Lab Results  ?Component Value Date  ? TSH 2.550 05/31/2021  ? ?Lab Results  ?Component Value Date  ? WBC 7.4 03/30/2021  ? HGB 13.8 03/30/2021  ? HCT 41.8 03/30/2021  ? MCV 95 03/30/2021  ? PLT 358 03/30/2021  ? ?Lab Results  ?Component Value Date  ?  NA 139 03/30/2021  ? K 4.5 03/30/2021  ? CO2 23 03/30/2021  ? GLUCOSE 85 03/30/2021  ? BUN 8 03/30/2021  ? CREATININE 0.63 03/30/2021  ? BILITOT 0.3 03/30/2021  ? ALKPHOS 71 03/30/2021  ? AST 12 03/30/2021  ?

## 2021-11-11 NOTE — Patient Instructions (Signed)
Please start taking Prozac 20 mg instead of 10 mg. Okay to take 2 tablets of 10 mg until you complete current supplies. ? ?Please continue to take other medications as prescribed. ? ?Please follow low carb diet as discussed and perform moderate exercise/walking at least 150 mins/week. ?

## 2021-11-11 NOTE — Assessment & Plan Note (Signed)
Intrusive thoughts and acts history since childhood ?Periods of repetitive activities ?Has episodes of paranoia at times ?Better with Prozac, but has resistant anhedonia and anxiety at times, increased dose of Prozac to 20 mg daily for now ?

## 2021-11-11 NOTE — Assessment & Plan Note (Signed)
Recently started using CPAP ?Advised to stay compliant with CPAP use ?Followed by pulmonology ?

## 2021-11-11 NOTE — Assessment & Plan Note (Signed)
Has facial wrinkles and acne ?Started tretinoin cream ?She has had tubal ligation in the past, has completed childbearing ?

## 2021-11-12 MED ORDER — TRETINOIN 0.025 % EX CREA: TOPICAL_CREAM | Freq: Every day | CUTANEOUS | 0 refills | Status: DC

## 2021-11-12 NOTE — Addendum Note (Signed)
Addended byTrena Platt on: 11/12/2021 02:11 PM ? ? Modules accepted: Orders ? ?

## 2021-11-15 ENCOUNTER — Other Ambulatory Visit: Payer: Self-pay | Admitting: Internal Medicine

## 2021-11-29 ENCOUNTER — Ambulatory Visit: Payer: Medicaid Other | Admitting: Internal Medicine

## 2022-01-04 ENCOUNTER — Ambulatory Visit: Payer: BC Managed Care – PPO | Admitting: Pulmonary Disease

## 2022-02-10 ENCOUNTER — Ambulatory Visit (INDEPENDENT_AMBULATORY_CARE_PROVIDER_SITE_OTHER): Payer: BC Managed Care – PPO | Admitting: Internal Medicine

## 2022-02-10 ENCOUNTER — Encounter: Payer: Self-pay | Admitting: Internal Medicine

## 2022-02-10 VITALS — BP 108/78 | HR 83 | Resp 18 | Ht 63.0 in | Wt 177.0 lb

## 2022-02-10 DIAGNOSIS — K429 Umbilical hernia without obstruction or gangrene: Secondary | ICD-10-CM

## 2022-02-10 DIAGNOSIS — E039 Hypothyroidism, unspecified: Secondary | ICD-10-CM | POA: Diagnosis not present

## 2022-02-10 DIAGNOSIS — F429 Obsessive-compulsive disorder, unspecified: Secondary | ICD-10-CM

## 2022-02-10 DIAGNOSIS — Z0000 Encounter for general adult medical examination without abnormal findings: Secondary | ICD-10-CM

## 2022-02-10 DIAGNOSIS — E559 Vitamin D deficiency, unspecified: Secondary | ICD-10-CM

## 2022-02-10 DIAGNOSIS — M6208 Separation of muscle (nontraumatic), other site: Secondary | ICD-10-CM | POA: Diagnosis not present

## 2022-02-10 DIAGNOSIS — E782 Mixed hyperlipidemia: Secondary | ICD-10-CM

## 2022-02-10 MED ORDER — LEVOTHYROXINE SODIUM 25 MCG PO TABS: 25.0000 ug | ORAL_TABLET | Freq: Every day | ORAL | 5 refills | Status: DC

## 2022-02-10 NOTE — Assessment & Plan Note (Signed)
S/p cosmetic surgery Well-healed scar Has abdominal bulging and discomfort around umbilical area, concern for umbilical hernia - referred to General surgery

## 2022-02-10 NOTE — Assessment & Plan Note (Signed)
Last vitamin D Lab Results  Component Value Date   VD25OH 24.9 (L) 03/30/2021   Advised to take Vitamin D 2000 IU QD

## 2022-02-10 NOTE — Progress Notes (Signed)
oll

## 2022-02-10 NOTE — Assessment & Plan Note (Signed)
Intrusive thoughts and acts history since childhood Periods of repetitive activities Has episodes of paranoia at times Better with Prozac 20 mg daily for now

## 2022-02-10 NOTE — Patient Instructions (Signed)
Please continue taking medications as prescribed.  Please continue to follow low carb diet and perform moderate exercise/walking at least 150 mins/week. 

## 2022-02-10 NOTE — Assessment & Plan Note (Signed)
Lab Results  Component Value Date   TSH 2.550 05/31/2021   On Levothyroxine 25 mcg QD Had weight gain and hair loss before starting Levothyroxine Check TSH and free T4 

## 2022-02-10 NOTE — Assessment & Plan Note (Signed)
Has bulging over umbilical area Has history of diastases recti repair surgeries Referred to general surgery

## 2022-02-10 NOTE — Progress Notes (Signed)
Established Patient Office Visit  Subjective:  Patient ID: Nicole Pollard, female    DOB: 10-26-1980  Age: 41 y.o. MRN: [id number]  CC:  Chief Complaint  Patient presents with   Follow-up    Follow up weight management ocd pt is concerned was told hernia at belly button this is uncomfortable when she eats meat     HPI Nicole Pollard is a 41 y.o. female with past medical history of hypothyroidism, abnormal PAP smear in the past (ASCUS), OCD, PTSD and diastasis recti s/p surgery who presents for f/u of her chronic medical conditions.  OCD and PTSD: She has been feeling better with Prozac now. Denies episodes of anxiety and anhedonia at times.  She currently denies any SI or HI.  She is able to focus better with Prozac now.  Her weight has been stable since the last visit.  She has been following low-carb diet and walking regularly.  She reports bulging around her umbilical area.  She has history of diastases recti and has had cosmetic surgeries in the past.  She is having discomfort in umbilical area after eating now.  Denies any  diarrhea, melena or hematochezia currently.    Past Medical History:  Diagnosis Date   Abnormal Pap smear of cervix    12/15/14 - ASCUS + HPV   Anxiety 01/28/2015   Overview:  On zoloft 10m  EPDS 7 on 03/18/15 Mood stable  Last Assessment & Plan:  Pt states that her mood is stable on zoloft 529m  Anxiety 01/28/2015   Overview:  On zoloft 5060mEPDS 7 on 03/18/15 Mood stable  Last Assessment & Plan:  Pt states that her mood is stable on zoloft 28m75mormatting of this note might be different from the original. On zoloft 28mg69mDS 7 on 03/18/15 Mood stable  Overview:  Overview:  On zoloft 28mg 78mS 7 on 03/18/15 Mood stable  Last Assessment & Plan:  Pt states that her mood is stable on zoloft 28mg  43m Assessmen   Cellulitis of abdominal wall 09/01/2016   Hypothyroid    Medical history non-contributory    Short cervix affecting pregnancy 05/23/2015   [facility]    06/23/14 ASCUS + HPV    Past Surgical History:  Procedure Laterality Date   ABDOMINOPLASTY  06/28/2016   [place]   [place]     CESAREAN SECTION     TUBAL LIGATION      Family History  Problem Relation Age of Onset   Alcohol abuse Mother    Drug abuse Father    Hypertension Father    Anxiety disorder Maternal Aunt    Cancer Maternal Grandmother        Cervical    Social History   Socioeconomic History   Marital status: Divorced    Spouse name: Not on file   Number of children: Not on file   Years of education: Not on file   Highest education level: Not on file  Occupational History   Not on file  Tobacco Use   Smoking status: Never   Smokeless tobacco: Never  Substance and Sexual Activity   Alcohol use: Yes    Comment: Social   Drug use: No   Sexual activity: Yes    Birth control/protection: Surgical  Other Topics Concern   Not on file  Social History Narrative   Not on file   Social Determinants  of Health   Financial Resource Strain: Low Risk  (04/01/2021)   Overall Financial Resource Strain (CARDIA)    Difficulty of Paying Living Expenses: Not hard at all  Food Insecurity: No Food Insecurity (04/01/2021)   Hunger Vital Sign    Worried About Running Out of Food in the Last Year: Never true    Ran Out of Food in the Last Year: Never true  Transportation Needs: No Transportation Needs (04/01/2021)   PRAPARE - Hydrologist (Medical): No    Lack of Transportation (Non-Medical): No  Physical Activity: Insufficiently Active (04/01/2021)   Exercise Vital Sign    Days of Exercise per Week: 1 day    Minutes of Exercise per Session: 10 min  Stress: Stress Concern Present (04/01/2021)   [place]    Feeling of Stress : Rather much  Social Connections: Moderately Integrated (04/01/2021)   Social Connection  and Isolation Panel [NHANES]    Frequency of Communication with Friends and Family: Twice a week    Frequency of Social Gatherings with Friends and Family: Once a week    Attends Religious Services: More than 4 times per year    Active Member of [employer] or Organizations: No    Attends Archivist Meetings: Never    Marital Status: Living with partner  Intimate Partner Violence: Not At Risk (04/01/2021)   Humiliation, Afraid, Rape, and Kick questionnaire    Fear of Current or Ex-Partner: No    Emotionally Abused: No    Physically Abused: No    Sexually Abused: No    Outpatient Medications Prior to Visit  Medication Sig Dispense Refill   cholecalciferol (VITAMIN D3) 25 MCG (1000 UNIT) tablet Take 1,000 Units by mouth daily.     FLUoxetine (PROZAC) 20 MG tablet Take 1 tablet (20 mg total) by mouth daily. 30 tablet 2   Magnesium 400 MG CAPS Take 400 mg by mouth daily. Taking for PVCs     metoprolol succinate (TOPROL-XL) 25 MG 24 hr tablet TAKE 1 TABLET(25 MG) BY MOUTH DAILY 90 tablet 3   tretinoin (RETIN-A) 0.025 % cream Apply topically at bedtime. 45 g 0   levothyroxine (SYNTHROID) 25 MCG tablet TAKE 1 TABLET(25 MCG) BY MOUTH DAILY 30 tablet 3   No facility-administered medications prior to visit.    No Known Allergies  ROS Review of Systems  Constitutional:  Negative for chills and fever.  HENT:  Negative for congestion, sinus pressure, sinus pain and sore throat.   Eyes:  Negative for pain and discharge.  Respiratory:  Negative for cough and shortness of breath.   Cardiovascular:  Negative for chest pain and palpitations.  Gastrointestinal:  Positive for constipation. Negative for abdominal pain, diarrhea, nausea and vomiting.  Endocrine: Negative for polydipsia and polyuria.  Genitourinary:  Negative for dysuria and hematuria.  Musculoskeletal:  Negative for neck pain and neck stiffness.  Skin:        Acne, facial wrinkles  Neurological:  Negative for dizziness and  weakness.  Psychiatric/Behavioral:  Negative for agitation, behavioral problems and suicidal ideas. The patient is nervous/anxious.       Objective:    Physical Exam Vitals reviewed.  Constitutional:      General: She is not in acute distress.    Appearance: She is obese. She is not diaphoretic.  HENT:     Head: Normocephalic and atraumatic.     Nose: Nose normal.  Mouth/Throat:     Mouth: Mucous membranes are moist.  Eyes:     General: No scleral icterus.    Extraocular Movements: Extraocular movements intact.  Cardiovascular:     Rate and Rhythm: Normal rate and regular rhythm.     Pulses: Normal pulses.     Heart sounds: Normal heart sounds. No murmur heard. Pulmonary:     Breath sounds: Normal breath sounds. No wheezing or rales.  Abdominal:     Palpations: Abdomen is soft.     Hernia: A hernia (Umbilical hernia) is present.  Musculoskeletal:     Cervical back: Neck supple. No tenderness.     Right lower leg: No edema.     Left lower leg: No edema.  Skin:    General: Skin is warm.     Findings: Rash (Acneform - over face) present.  Neurological:     General: No focal deficit present.     Mental Status: She is alert and oriented to person, place, and time.     Sensory: No sensory deficit.     Motor: No weakness.  Psychiatric:        Mood and Affect: Mood normal. Mood is not anxious.        Behavior: Behavior normal.     BP 108/78 (BP Location: Right Arm, Patient Position: Sitting, Cuff Size: Normal)   Pulse 83   Resp 18   Ht '5\' 3"'  (1.6 m)   Wt 177 lb (80.3 kg)   SpO2 97%   BMI 31.35 kg/m  Wt Readings from Last 3 Encounters:  02/10/22 177 lb (80.3 kg)  11/11/21 178 lb (80.7 kg)  10/01/21 174 lb 6.4 oz (79.1 kg)    Lab Results  Component Value Date   TSH 2.550 05/31/2021   Lab Results  Component Value Date   WBC 7.4 03/30/2021   HGB 13.8 03/30/2021   HCT 41.8 03/30/2021   MCV 95 03/30/2021   PLT 358 03/30/2021   Lab Results  Component  Value Date   NA 139 03/30/2021   K 4.5 03/30/2021   CO2 23 03/30/2021   GLUCOSE 85 03/30/2021   BUN 8 03/30/2021   CREATININE 0.63 03/30/2021   BILITOT 0.3 03/30/2021   ALKPHOS 71 03/30/2021   AST 12 03/30/2021   ALT 9 03/30/2021   PROT 6.2 03/30/2021   ALBUMIN 4.2 03/30/2021   CALCIUM 8.8 03/30/2021   ANIONGAP 9 07/25/2017   EGFR 116 03/30/2021   Lab Results  Component Value Date   CHOL 201 (H) 03/30/2021   Lab Results  Component Value Date   HDL 64 03/30/2021   Lab Results  Component Value Date   LDLCALC 113 (H) 03/30/2021   Lab Results  Component Value Date   TRIG 140 03/30/2021   Lab Results  Component Value Date   CHOLHDL 3.1 03/30/2021   Lab Results  Component Value Date   HGBA1C 5.1 03/30/2021      Assessment & Plan:   Problem List Items Addressed This Visit       Endocrine   Hypothyroidism - Primary    Lab Results  Component Value Date   TSH 2.550 05/31/2021  On Levothyroxine 25 mcg QD Had weight gain and hair loss before starting Levothyroxine Check TSH and free T4       Relevant Medications   levothyroxine (SYNTHROID) 25 MCG tablet   Other Relevant Orders   TSH + free T4     Musculoskeletal and Integument   Diastasis recti  S/p cosmetic surgery Well-healed scar Has abdominal bulging and discomfort around umbilical area, concern for umbilical hernia - referred to General surgery      Relevant Orders   Ambulatory referral to General Surgery     Other   Vitamin D deficiency    Last vitamin D Lab Results  Component Value Date   VD25OH 24.9 (L) 03/30/2021  Advised to take Vitamin D 2000 IU QD      Relevant Orders   VITAMIN D 25 Hydroxy (Vit-D Deficiency, Fractures)   Mixed hyperlipidemia    Lipid profile reviewed Advised to continue plant-based, low cholesterol diet      Relevant Orders   Lipid panel   Obsessive-compulsive disorder    Intrusive thoughts and acts history since childhood Periods of repetitive  activities Has episodes of paranoia at times Better with Prozac 20 mg daily for now      Umbilical hernia without obstruction and without gangrene    Has bulging over umbilical area Has history of diastases recti repair surgeries Referred to general surgery      Relevant Orders   Ambulatory referral to General Surgery   Other Visit Diagnoses     Annual physical exam       Relevant Orders   Hemoglobin A1c   CMP14+EGFR   CBC with Differential/Platelet       Meds ordered this encounter  Medications   levothyroxine (SYNTHROID) 25 MCG tablet    Sig: Take 1 tablet (25 mcg total) by mouth daily before breakfast.    Dispense:  30 tablet    Refill:  5    Follow-up: Return in about 4 months (around 06/13/2022) for Annual physical.    Lindell Spar, MD

## 2022-02-10 NOTE — Assessment & Plan Note (Signed)
Lipid profile reviewed Advised to continue plant-based, low cholesterol diet

## 2022-02-24 ENCOUNTER — Other Ambulatory Visit: Payer: Self-pay | Admitting: Internal Medicine

## 2022-02-24 DIAGNOSIS — F429 Obsessive-compulsive disorder, unspecified: Secondary | ICD-10-CM

## 2022-03-10 ENCOUNTER — Ambulatory Visit: Payer: BC Managed Care – PPO | Admitting: Surgery

## 2022-03-30 ENCOUNTER — Other Ambulatory Visit: Payer: Self-pay | Admitting: Internal Medicine

## 2022-03-30 DIAGNOSIS — F429 Obsessive-compulsive disorder, unspecified: Secondary | ICD-10-CM

## 2022-04-28 ENCOUNTER — Telehealth: Payer: PRIVATE HEALTH INSURANCE | Admitting: Internal Medicine

## 2022-04-28 ENCOUNTER — Encounter: Payer: Self-pay | Admitting: Internal Medicine

## 2022-04-28 DIAGNOSIS — M25572 Pain in left ankle and joints of left foot: Secondary | ICD-10-CM

## 2022-04-28 DIAGNOSIS — M25571 Pain in right ankle and joints of right foot: Secondary | ICD-10-CM | POA: Diagnosis not present

## 2022-04-28 MED ORDER — IBUPROFEN 600 MG PO TABS: 600.0000 mg | ORAL_TABLET | Freq: Three times a day (TID) | ORAL | 1 refills | Status: DC | PRN

## 2022-04-28 NOTE — Progress Notes (Signed)
Virtual Visit via Video Note   Because of Nicole Pollard's co-morbid illnesses, she is at least at moderate risk for complications without adequate follow up.  This format is felt to be most appropriate for this patient at this time.  All issues noted in this document were discussed and addressed.  A limited physical exam was performed with this format.      Evaluation Performed:  Follow-up visit  Date:  04/28/2022   ID:  Nicole Pollard, DOB August 18, 1980, MRN [id number]  Patient Location: Home Provider Location: Office/Clinic  Participants: Patient Location of Patient: Home Location of Provider: Telehealth Consent was obtain for visit to be over via telehealth. I verified that I am speaking with the correct person using two identifiers.  PCP:  Anabel Halon, MD   Chief Complaint: Bilateral ankle pain and swelling  History of Present Illness:    Nicole Pollard is a 41 y.o. female who has a televisit for complaint of bilateral ankle pain and swelling for the last 1 week, which is intermittent, worse in the morning.  She went to urgent care, and was given prednisone and ibuprofen.  She has completed them, but still has recurrent ankle pain and swelling.  She denies any recent injury.  Of note, she reports that she had bilateral knee pain in the last month, and went to urgent care.  She was given naproxen at that time.  Of note, she has started exercise regimen at the gym for the last 2 months.  The patient does not have symptoms concerning for COVID-19 infection (fever, chills, cough, or new shortness of breath).   Past Medical, Surgical, Social History, Allergies, and Medications have been Reviewed.  Past Medical History:  Diagnosis Date   Abnormal Pap smear of cervix    12/15/14 - ASCUS + HPV   Anxiety 01/28/2015   Overview:  On zoloft 50mg   EPDS 7 on 03/18/15 Mood stable  Last Assessment & Plan:  Pt states that her mood is stable on zoloft 50mg    Anxiety 01/28/2015   Overview:   On zoloft 50mg   EPDS 7 on 03/18/15 Mood stable  Last Assessment & Plan:  Pt states that her mood is stable on zoloft 50mg   Formatting of this note might be different from the original. On zoloft 50mg   EPDS 7 on 03/18/15 Mood stable  Overview:  Overview:  On zoloft 50mg   EPDS 7 on 03/18/15 Mood stable  Last Assessment & Plan:  Pt states that her mood is stable on zoloft 50mg   Last Assessmen   Cellulitis of abdominal wall 09/01/2016   Hypothyroid    Medical history non-contributory    Short cervix affecting pregnancy 05/23/2015   [facility]    Vaginal Pap smear, abnormal    06/23/14 ASCUS + HPV   Past Surgical History:  Procedure Laterality Date   ABDOMINOPLASTY  06/28/2016   [place],   APPENDECTOMY     CESAREAN SECTION     TUBAL LIGATION       Current Meds  Medication Sig   ibuprofen (ADVIL) 600 MG tablet Take 1 tablet (600 mg total) by mouth every 8 (eight) hours as needed.     Allergies:   Patient has no known allergies.   ROS:   Please see the history of present illness.     All other systems reviewed and are negative.   Labs/Other Tests and Data Reviewed:    Recent Labs: 05/31/2021: TSH 2.550  Recent Lipid Panel Lab Results  Component Value Date/Time   CHOL 201 (H) 03/30/2021 08:14 AM   TRIG 140 03/30/2021 08:14 AM   HDL 64 03/30/2021 08:14 AM   CHOLHDL 3.1 03/30/2021 08:14 AM   LDLCALC 113 (H) 03/30/2021 08:14 AM    Wt Readings from Last 3 Encounters:  02/10/22 177 lb (80.3 kg)  11/11/21 178 lb (80.7 kg)  10/01/21 174 lb 6.4 oz (79.1 kg)     Objective:    Vital Signs:  There were no vitals taken for this visit.   GEN:  no acute distress MUSCULOSKELETAL:  Mild bilateral ankle swelling  ASSESSMENT & PLAN:    Acute bilateral ankle pain Swelling likely due to recent exercise regimen Has been doing weightlifting Could have had ligament strain Rest, ice, compression and leg elevation Ibuprofen as needed If persistent, will  refer to Orthopedic surgeon   Time:   Today, I have spent 13 minutes reviewing the chart, including problem list, medications, and with the patient with telehealth technology discussing the above problems.   Medication Adjustments/Labs and Tests Ordered: Current medicines are reviewed at length with the patient today.  Concerns regarding medicines are outlined above.   Tests Ordered: No orders of the defined types were placed in this encounter.   Medication Changes: Meds ordered this encounter  Medications   ibuprofen (ADVIL) 600 MG tablet    Sig: Take 1 tablet (600 mg total) by mouth every 8 (eight) hours as needed.    Dispense:  30 tablet    Refill:  1     Note: This dictation was prepared with Dragon dictation along with smaller phrase technology. Similar sounding words can be transcribed inadequately or may not be corrected upon review. Any transcriptional errors that result from this process are unintentional.      Disposition:  Follow up  Signed, Lindell Spar, MD  04/28/2022 9:34 AM     [facility]

## 2022-04-28 NOTE — Assessment & Plan Note (Signed)
Swelling likely due to recent exercise regimen Has been doing weightlifting Could have had ligament strain Rest, ice, compression and leg elevation Ibuprofen as needed If persistent, will refer to Orthopedic surgeon

## 2022-05-02 ENCOUNTER — Other Ambulatory Visit: Payer: Self-pay | Admitting: Internal Medicine

## 2022-05-02 ENCOUNTER — Ambulatory Visit: Payer: Medicaid Other | Admitting: Internal Medicine

## 2022-05-02 ENCOUNTER — Encounter: Payer: Self-pay | Admitting: Internal Medicine

## 2022-05-02 VITALS — BP 124/82 | HR 86 | Resp 18 | Ht 63.0 in | Wt 176.0 lb

## 2022-05-02 DIAGNOSIS — M25571 Pain in right ankle and joints of right foot: Secondary | ICD-10-CM

## 2022-05-02 DIAGNOSIS — M7989 Other specified soft tissue disorders: Secondary | ICD-10-CM | POA: Diagnosis not present

## 2022-05-02 DIAGNOSIS — Z23 Encounter for immunization: Secondary | ICD-10-CM | POA: Diagnosis not present

## 2022-05-02 DIAGNOSIS — F429 Obsessive-compulsive disorder, unspecified: Secondary | ICD-10-CM

## 2022-05-02 DIAGNOSIS — M25572 Pain in left ankle and joints of left foot: Secondary | ICD-10-CM

## 2022-05-02 MED ORDER — FUROSEMIDE 20 MG PO TABS: 20.0000 mg | ORAL_TABLET | Freq: Every day | ORAL | 0 refills | Status: DC

## 2022-05-02 NOTE — Patient Instructions (Signed)
Please take Ibuprofen as needed for ankle pain.  Please take Lasix as needed for severe leg swelling.  Please perform RICE - rest, ice, compression and elevation for ankle swelling.

## 2022-05-02 NOTE — Progress Notes (Signed)
Acute Office Visit  Subjective:    Patient ID: Nicole Pollard, female    DOB: 1981-01-16, 41 y.o.   MRN: [id number]  Chief Complaint  Patient presents with   Leg Swelling    Both patient legs and feet swelling and hurting since 04-24-22 has been to urgent care and er they swell and hurt has been given prednisone ibuprofen and naproxen this did not work    HPI Patient is in today for complaint of bilateral leg swelling and ankle pain in the anterior aspect.  Her pain is sharp, worse with walking and better with rest.  She has had 2 ER visits for it.  She was given prednisone and ibuprofen, but has not been helping much.  She denies any recent known injury.  She has recently started exercise regimen including weight lifting, but has restricted exercise after last virtual visit with me.  She is concerned about high normal proBNP, but denies any dyspnea, orthopnea or PND.  Past Medical History:  Diagnosis Date   Abnormal Pap smear of cervix    12/15/14 - ASCUS + HPV   Anxiety 01/28/2015   Overview:  On zoloft 50mg   EPDS 7 on 03/18/15 Mood stable  Last Assessment & Plan:  Pt states that her mood is stable on zoloft 50mg    Anxiety 01/28/2015   Overview:  On zoloft 50mg   EPDS 7 on 03/18/15 Mood stable  Last Assessment & Plan:  Pt states that her mood is stable on zoloft 50mg   Formatting of this note might be different from the original. On zoloft 50mg   EPDS 7 on 03/18/15 Mood stable  Overview:  Overview:  On zoloft 50mg   EPDS 7 on 03/18/15 Mood stable  Last Assessment & Plan:  Pt states that her mood is stable on zoloft 50mg   Last Assessmen   Cellulitis of abdominal wall 09/01/2016   Hypothyroid    Medical history non-contributory    Short cervix affecting pregnancy 05/23/2015   [facility]    Vaginal Pap smear, abnormal    06/23/14 ASCUS + HPV    Past Surgical History:  Procedure Laterality Date   ABDOMINOPLASTY  06/28/2016   [place],   APPENDECTOMY     CESAREAN  SECTION     TUBAL LIGATION      Family History  Problem Relation Age of Onset   Alcohol abuse Mother    Drug abuse Father    Hypertension Father    Anxiety disorder Maternal Aunt    Cancer Maternal Grandmother        Cervical    Social History   Socioeconomic History   Marital status: Divorced    Spouse name: Not on file   Number of children: Not on file   Years of education: Not on file   Highest education level: Not on file  Occupational History   Not on file  Tobacco Use   Smoking status: Never   Smokeless tobacco: Never  Substance and Sexual Activity   Alcohol use: Yes    Comment: Social   Drug use: No   Sexual activity: Yes    Birth control/protection: Surgical  Other Topics Concern   Not on file  Social History Narrative   Not on file   Social Determinants of Health   Financial Resource Strain: Low Risk  (04/01/2021)   Overall Financial Resource Strain (CARDIA)    Difficulty of Paying Living Expenses: Not hard at all  Food Insecurity: No Food Insecurity (  04/01/2021)   Hunger Vital Sign    Worried About Running Out of Food in the Last Year: Never true    Ran Out of Food in the Last Year: Never true  Transportation Needs: No Transportation Needs (04/01/2021)   PRAPARE - Administrator, Civil Service (Medical): No    Lack of Transportation (Non-Medical): No  Physical Activity: Insufficiently Active (04/01/2021)   Exercise Vital Sign    Days of Exercise per Week: 1 day    Minutes of Exercise per Session: 10 min  Stress: Stress Concern Present (04/01/2021)   [employer] of Occupational Health - Occupational Stress Questionnaire    Feeling of Stress : Rather much  Social Connections: Moderately Integrated (04/01/2021)   Social Connection and Isolation Panel [NHANES]    Frequency of Communication with Friends and Family: Twice a week    Frequency of Social Gatherings with Friends and Family: Once a week    Attends Religious Services: More than 4 times  per year    Active Member of [employer] or Organizations: No    Attends Banker Meetings: Never    Marital Status: Living with partner  Intimate Partner Violence: Not At Risk (04/01/2021)   Humiliation, Afraid, Rape, and Kick questionnaire    Fear of Current or Ex-Partner: No    Emotionally Abused: No    Physically Abused: No    Sexually Abused: No    Outpatient Medications Prior to Visit  Medication Sig Dispense Refill   cholecalciferol (VITAMIN D3) 25 MCG (1000 UNIT) tablet Take 1,000 Units by mouth daily.     ibuprofen (ADVIL) 600 MG tablet Take 1 tablet (600 mg total) by mouth every 8 (eight) hours as needed. 30 tablet 1   levothyroxine (SYNTHROID) 25 MCG tablet Take 1 tablet (25 mcg total) by mouth daily before breakfast. 30 tablet 5   Magnesium 400 MG CAPS Take 400 mg by mouth daily. Taking for PVCs     metoprolol succinate (TOPROL-XL) 25 MG 24 hr tablet TAKE 1 TABLET(25 MG) BY MOUTH DAILY 90 tablet 3   tretinoin (RETIN-A) 0.025 % cream Apply topically at bedtime. 45 g 0   FLUoxetine (PROZAC) 20 MG capsule TAKE 1 CAPSULE BY MOUTH DAILY. 30 capsule 0   No facility-administered medications prior to visit.    No Known Allergies  Review of Systems  Constitutional:  Negative for chills and fever.  Respiratory:  Negative for cough and shortness of breath.   Cardiovascular:  Negative for chest pain and palpitations.  Gastrointestinal:  Negative for nausea and vomiting.  Genitourinary:  Negative for dysuria and hematuria.  Musculoskeletal:  Negative for neck pain and neck stiffness.       B/l ankle pain and swelling  Skin:  Negative for rash.  Neurological:  Negative for dizziness and weakness.  Psychiatric/Behavioral:  Negative for agitation and behavioral problems. The patient is nervous/anxious.        Objective:    Physical Exam Vitals reviewed.  Constitutional:      General: She is not in acute distress.    Appearance: She is obese. She is not diaphoretic.   HENT:     Head: Normocephalic and atraumatic.     Nose: Nose normal.     Mouth/Throat:     Mouth: Mucous membranes are moist.  Eyes:     General: No scleral icterus.    Extraocular Movements: Extraocular movements intact.  Cardiovascular:     Rate and Rhythm: Normal rate and regular rhythm.  Pulses: Normal pulses.     Heart sounds: Normal heart sounds. No murmur heard. Pulmonary:     Breath sounds: Normal breath sounds. No wheezing or rales.  Musculoskeletal:     Cervical back: Neck supple. No tenderness.     Right lower leg: Edema (1+) present.     Left lower leg: Edema (1+) present.  Skin:    General: Skin is warm.     Findings: Rash (Acneform - over face) present.  Neurological:     General: No focal deficit present.     Mental Status: She is alert and oriented to person, place, and time.  Psychiatric:        Mood and Affect: Mood normal. Mood is not anxious.        Behavior: Behavior normal.     BP 124/82 (BP Location: Right Arm, Patient Position: Sitting, Cuff Size: Normal)   Pulse 86   Resp 18   Ht 5\' 3"  (1.6 m)   Wt 176 lb (79.8 kg)   SpO2 98%   BMI 31.18 kg/m  Wt Readings from Last 3 Encounters:  05/02/22 176 lb (79.8 kg)  02/10/22 177 lb (80.3 kg)  11/11/21 178 lb (80.7 kg)        Assessment & Plan:   Problem List Items Addressed This Visit       Other   Acute bilateral ankle pain - Primary    Swelling likely due to recent exercise regimen Had been doing weightlifting Could have had ligament strain Rest, ice, compression and leg elevation Ibuprofen as needed Persistent, will refer to Orthopedic surgeon      Relevant Orders   Ambulatory referral to Orthopedic Surgery   Leg swelling    Had pitting edema earlier States it gets worse upon standing High normal proBNP, but low risk for CHF Could be mild venous insufficiency Lasix 20 mg QD PRN X 1 week Leg elevation and compression stocking      Relevant Medications   furosemide (LASIX)  20 MG tablet   Other Visit Diagnoses     Need for immunization against influenza       Relevant Orders   Flu Vaccine QUAD 60mo+IM (Fluarix, Fluzone & Alfiuria Quad PF) (Completed)        Meds ordered this encounter  Medications   furosemide (LASIX) 20 MG tablet    Sig: Take 1 tablet (20 mg total) by mouth daily.    Dispense:  10 tablet    Refill:  0     Sabryn Preslar Keith Rake, MD

## 2022-05-03 ENCOUNTER — Ambulatory Visit: Payer: Medicaid Other | Admitting: Internal Medicine

## 2022-05-06 DIAGNOSIS — M7989 Other specified soft tissue disorders: Secondary | ICD-10-CM | POA: Insufficient documentation

## 2022-05-06 NOTE — Assessment & Plan Note (Signed)
Had pitting edema earlier States it gets worse upon standing High normal proBNP, but low risk for CHF Could be mild venous insufficiency Lasix 20 mg QD PRN X 1 week Leg elevation and compression stocking

## 2022-05-06 NOTE — Assessment & Plan Note (Signed)
Swelling likely due to recent exercise regimen Had been doing weightlifting Could have had ligament strain Rest, ice, compression and leg elevation Ibuprofen as needed Persistent, will refer to Orthopedic surgeon

## 2022-05-11 ENCOUNTER — Ambulatory Visit (INDEPENDENT_AMBULATORY_CARE_PROVIDER_SITE_OTHER): Payer: PRIVATE HEALTH INSURANCE | Admitting: Orthopedic Surgery

## 2022-05-11 ENCOUNTER — Encounter: Payer: Self-pay | Admitting: Orthopedic Surgery

## 2022-05-11 VITALS — Ht 63.0 in | Wt 176.0 lb

## 2022-05-11 DIAGNOSIS — M25572 Pain in left ankle and joints of left foot: Secondary | ICD-10-CM | POA: Diagnosis not present

## 2022-05-11 DIAGNOSIS — M25571 Pain in right ankle and joints of right foot: Secondary | ICD-10-CM

## 2022-05-13 ENCOUNTER — Encounter: Payer: Self-pay | Admitting: Orthopedic Surgery

## 2022-05-13 NOTE — Progress Notes (Signed)
New Patient Visit  Assessment: Nicole Pollard is a 41 y.o. female with the following: 1. Acute bilateral ankle pain  Plan: Sadae Arrazola has pain in both of her ankles.  No specific injury.  Radiographs are negative.  Swelling improved with medications, provided by her primary care doctor.  She notes that she recently started [employer].  The pain she is currently experiencing is likely related to her recent increase in activity.  This was discussed with the patient in clinic today.  She states her understanding.  I think this will gradually improve.  If she continues to struggle, we can recommend physical therapy.  She will follow-up as needed.  Follow-up: No follow-ups on file.  Subjective:  Chief Complaint  Patient presents with   Ankle Pain    Bilat ankle pain that has gotten worse over the past 3 wks. NKI today L>R    History of Present Illness: Nicole Pollard is a 41 y.o. female who has been referred by  Ihor Dow, MD for evaluation of bilateral ankle pain.  She states she noticed bilateral ankle pain and swelling over the past month or so.  She saw her primary care provider, and was concerned about pitting edema.  She was provided with some medications, which improved the swelling.  However, she continues to have pain.  She denies any specific injury.  She states that she started the [employer] approximately 6-8 weeks ago.  This involves a lot of physical activity.  This is completed wearing boots for training.  The pain is diffuse.  She is not a noticed any bruising.  She is not taking any medications on a consistent basis.   Review of Systems: No fevers or chills No numbness or tingling No chest pain No shortness of breath No bowel or bladder dysfunction No GI distress No headaches   Medical History:  Past Medical History:  Diagnosis Date   Abnormal Pap smear of cervix    12/15/14 - ASCUS + HPV   Anxiety 01/28/2015   Overview:  On zoloft 50mg   EPDS 7 on  03/18/15 Mood stable  Last Assessment & Plan:  Pt states that her mood is stable on zoloft 50mg    Anxiety 01/28/2015   Overview:  On zoloft 50mg   EPDS 7 on 03/18/15 Mood stable  Last Assessment & Plan:  Pt states that her mood is stable on zoloft 50mg   Formatting of this note might be different from the original. On zoloft 50mg   EPDS 7 on 03/18/15 Mood stable  Overview:  Overview:  On zoloft 50mg   EPDS 7 on 03/18/15 Mood stable  Last Assessment & Plan:  Pt states that her mood is stable on zoloft 50mg   Last Assessmen   Cellulitis of abdominal wall 09/01/2016   Hypothyroid    Medical history non-contributory    Short cervix affecting pregnancy 05/23/2015   Cerclage [facility]/03/15 at [facility]    Vaginal Pap smear, abnormal    06/23/14 ASCUS + HPV    Past Surgical History:  Procedure Laterality Date   ABDOMINOPLASTY  06/28/2016   [place], [place]   APPENDECTOMY     CESAREAN SECTION     TUBAL LIGATION      Family History  Problem Relation Age of Onset   Alcohol abuse Mother    Drug abuse Father    Hypertension Father    Anxiety disorder Maternal Aunt    Cancer Maternal Grandmother        Cervical  Social History   Tobacco Use   Smoking status: Never   Smokeless tobacco: Never  Substance Use Topics   Alcohol use: Yes    Comment: Social   Drug use: No    No Known Allergies  No outpatient medications have been marked as taking for the 05/11/22 encounter (Office Visit) with Oliver Barre, MD.    Objective: Ht 5\' 3"  (1.6 m)   Wt 176 lb (79.8 kg)   BMI 31.18 kg/m   Physical Exam:  General: Alert and oriented. and No acute distress. Gait: Normal gait.  Bilateral ankles without significant swelling.  No point tenderness.  She has good range of motion of both ankles.  Good strength.  Toes warm and well-perfused.  Sensation is intact throughout her feet.  IMAGING: I personally reviewed images previously obtained from the ED   Bilateral ankle x-rays without acute injury.   Overall good alignment.  New Medications:  No orders of the defined types were placed in this encounter.     , MD  05/13/2022 8:22 AM

## 2022-06-13 ENCOUNTER — Encounter: Payer: BC Managed Care – PPO | Admitting: Internal Medicine

## 2022-06-21 IMAGING — DX DG CERVICAL SPINE COMPLETE 4+V
6 series · 6 of 6 positions shown · non-contrast
Comparison: None.

CLINICAL DATA: Cervical pain (neck) L9I.0 ([id number]-CM)

EXAM:
CERVICAL SPINE - COMPLETE 4+ VIEW

[c-spine lat]
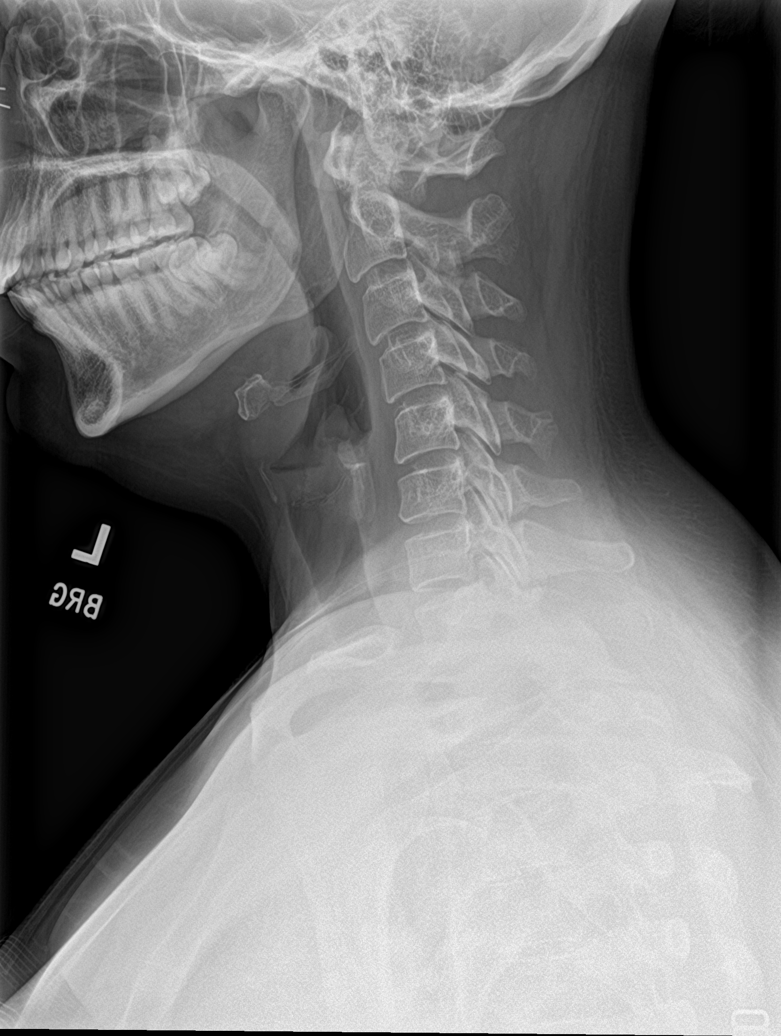

[c-spine obl (1 of 2)]
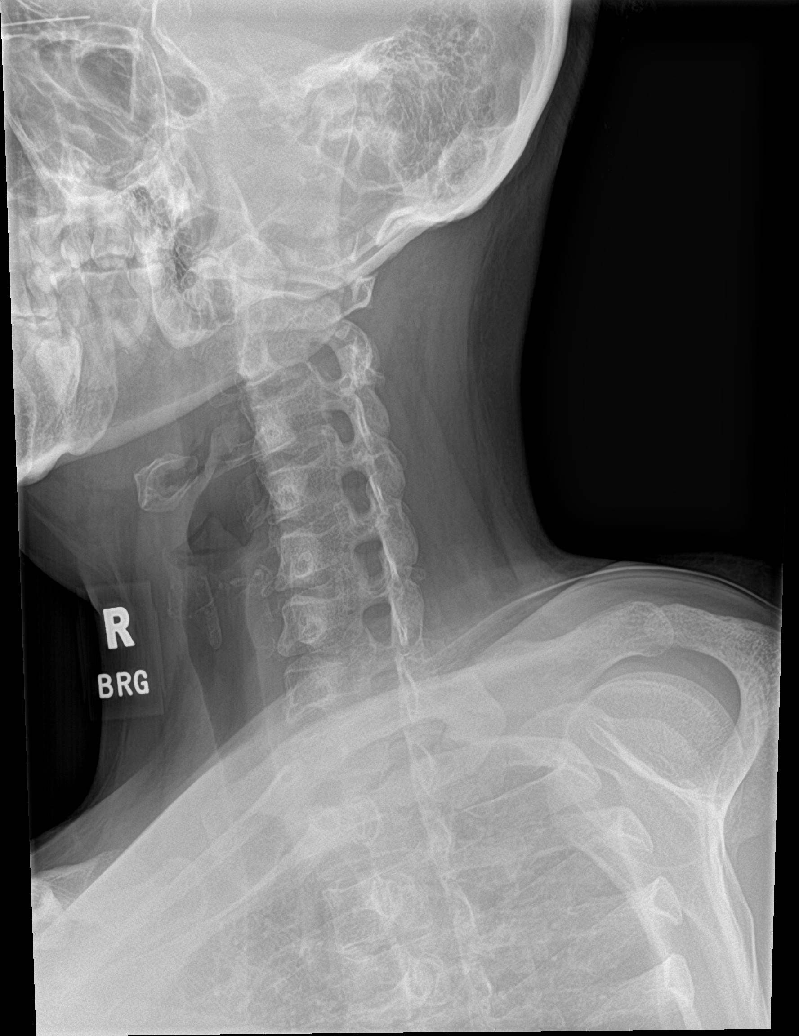

[c-spine obl (2 of 2)]
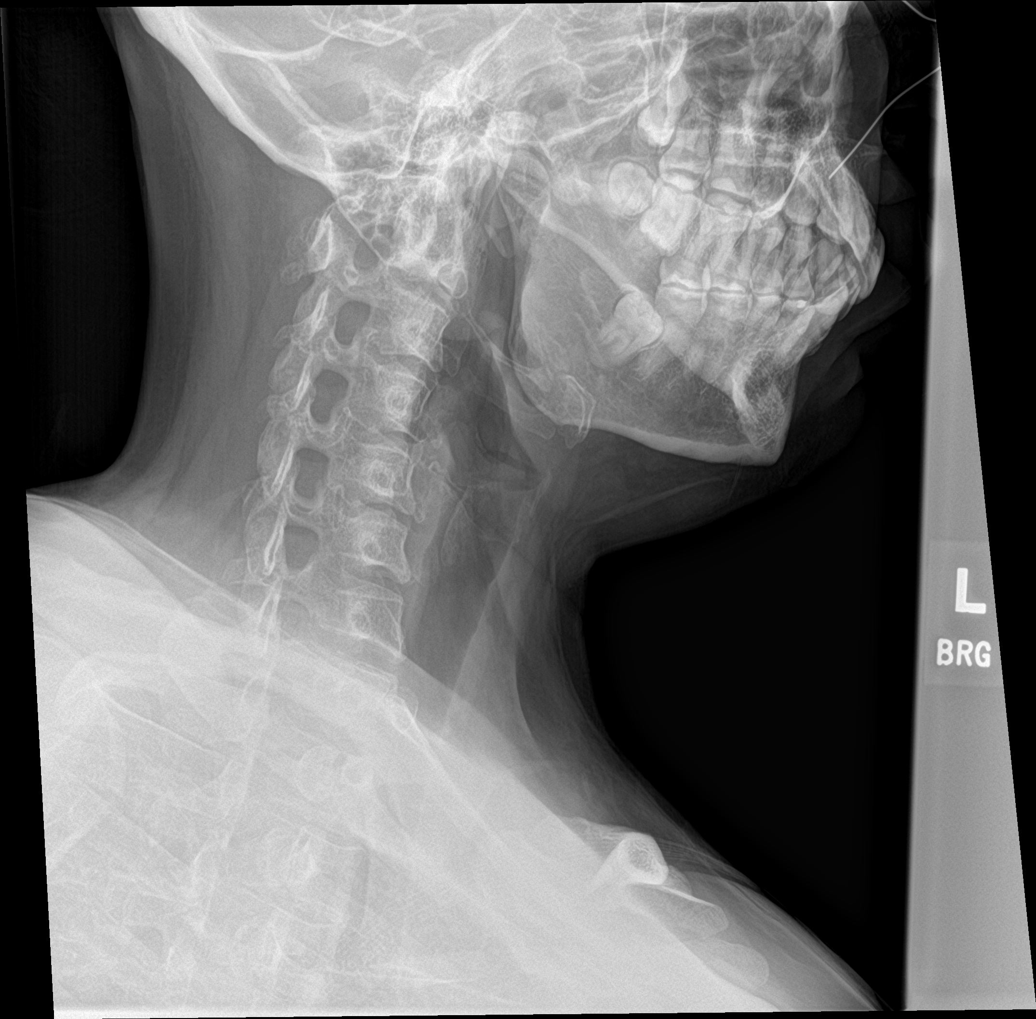

[c-spine open mouth]
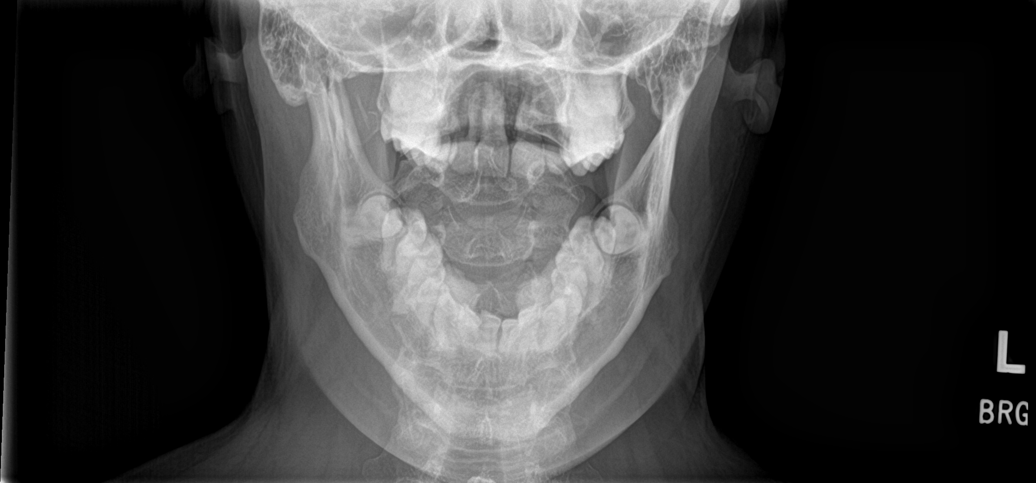

[c-spine swimmers trauma]
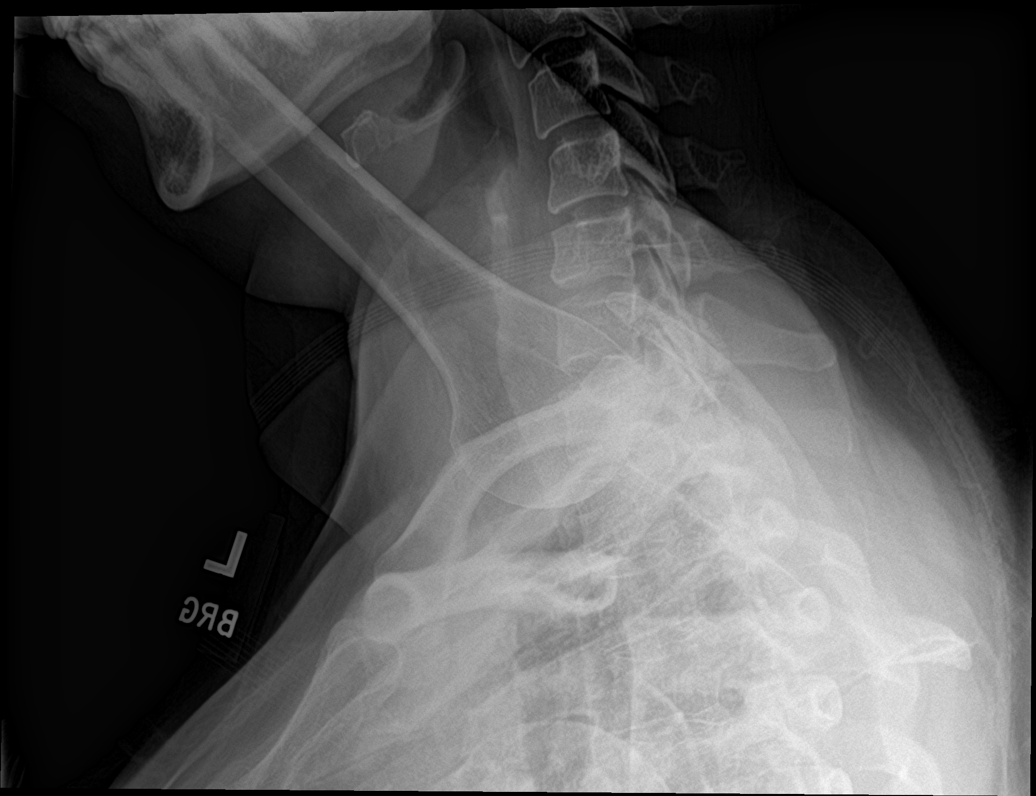

[c-spine ap]
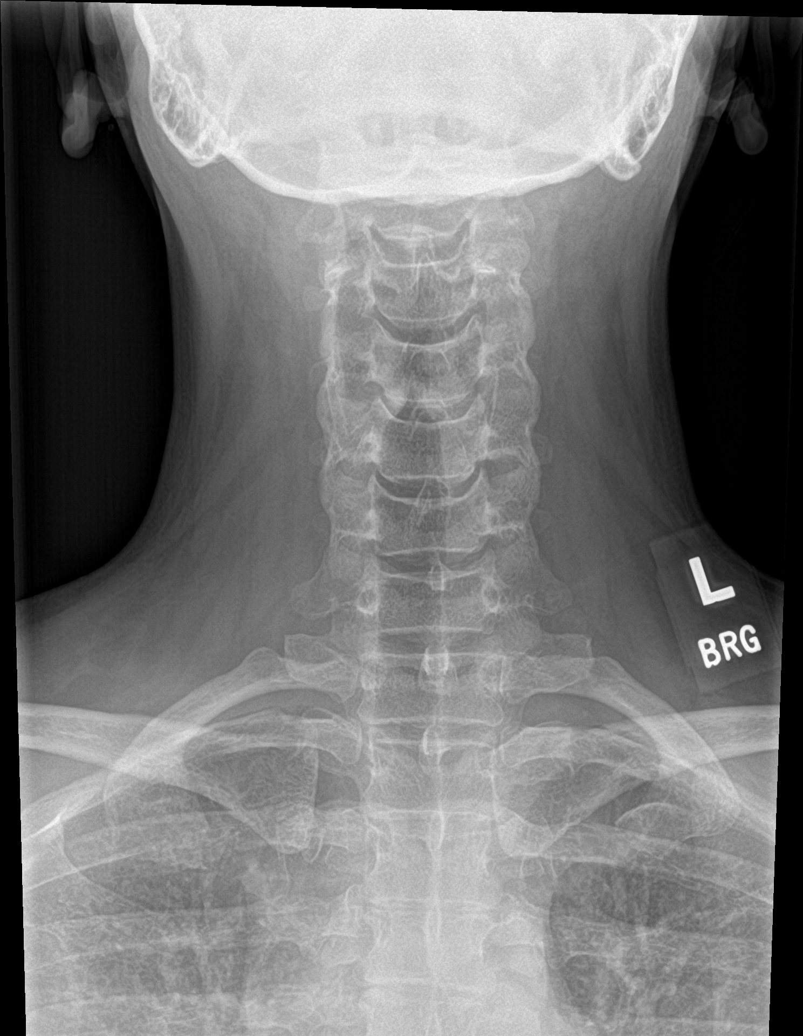

[6 of 6 positions shown; findings below may reference images not displayed]

FINDINGS: Reversal of the normal cervical lordosis without sagittal
subluxation. The lateral masses of C1 and C2 are appropriately
aligned on the open-mouth odontoid view. Intervertebral disc heights
are maintained. Mild lower cervical facet arthropathy. Prevertebral
soft tissues are within normal limits.
IMPRESSION: 1. No evidence of acute fracture or traumatic malalignment.
2. Mild lower cervical facet arthropathy.

## 2022-06-22 ENCOUNTER — Other Ambulatory Visit: Payer: Self-pay | Admitting: Internal Medicine

## 2022-06-22 DIAGNOSIS — F429 Obsessive-compulsive disorder, unspecified: Secondary | ICD-10-CM

## 2022-06-28 ENCOUNTER — Ambulatory Visit (INDEPENDENT_AMBULATORY_CARE_PROVIDER_SITE_OTHER): Payer: PRIVATE HEALTH INSURANCE | Admitting: Internal Medicine

## 2022-06-28 ENCOUNTER — Encounter: Payer: Self-pay | Admitting: Internal Medicine

## 2022-06-28 VITALS — BP 121/73 | HR 78 | Ht 63.0 in | Wt 173.6 lb

## 2022-06-28 DIAGNOSIS — F429 Obsessive-compulsive disorder, unspecified: Secondary | ICD-10-CM | POA: Diagnosis not present

## 2022-06-28 DIAGNOSIS — E039 Hypothyroidism, unspecified: Secondary | ICD-10-CM | POA: Diagnosis not present

## 2022-06-28 DIAGNOSIS — Z0001 Encounter for general adult medical examination with abnormal findings: Secondary | ICD-10-CM | POA: Diagnosis not present

## 2022-06-28 DIAGNOSIS — E669 Obesity, unspecified: Secondary | ICD-10-CM

## 2022-06-28 MED ORDER — FLUOXETINE HCL 20 MG PO CAPS: 20.0000 mg | ORAL_CAPSULE | Freq: Every day | ORAL | 5 refills | Status: DC

## 2022-06-28 NOTE — Assessment & Plan Note (Signed)
Intrusive thoughts and acts history since childhood Periods of repetitive activities Has episodes of paranoia at times Better with Prozac 20 mg daily now 

## 2022-06-28 NOTE — Patient Instructions (Signed)
Please continue taking medications as prescribed.  Please continue to follow low carb diet and perform moderate exercise/walking at least 150 mins/week. 

## 2022-06-28 NOTE — Progress Notes (Signed)
Established Patient Office Visit  Subjective:  Patient ID: Nicole Pollard, female    DOB: 07/12/1981  Age: 41 y.o. MRN: [id number]  CC:  Chief Complaint  Patient presents with   Annual Exam    CPE no weight loss exercising and eating better only lost 4 pounds    HPI Nicole Pollard is a 41 y.o. female with past medical history of hypothyroidism, OCD, PTSD and diastasis recti s/p surgery who presents for annual physical.  OCD and PTSD: She has been feeling better with Prozac now. Denies episodes of anxiety and anhedonia at times.  She currently denies any SI or HI.  She is able to focus better with Prozac now.   Her weight has been stable since the last visit.  She has been following low-carb diet and walking regularly.  She has been doing Scientist, research (life sciences) and has been doing rigorous physical exercises.  She takes levothyroxine for hypothyroidism.  She denies any recent change in appetite, tremors or palpitations.   Past Medical History:  Diagnosis Date   Abnormal Pap smear of cervix    12/15/14 - ASCUS + HPV   Anxiety 01/28/2015   Overview:  On zoloft 62m  EPDS 7 on 03/18/15 Mood stable  Last Assessment & Plan:  Pt states that her mood is stable on zoloft 510m  Anxiety 01/28/2015   Overview:  On zoloft 5022mEPDS 7 on 03/18/15 Mood stable  Last Assessment & Plan:  Pt states that her mood is stable on zoloft 53m62mormatting of this note might be different from the original. On zoloft 53mg21mDS 7 on 03/18/15 Mood stable  Overview:  Overview:  On zoloft 53mg 15mS 7 on 03/18/15 Mood stable  Last Assessment & Plan:  Pt states that her mood is stable on zoloft 53mg  75m Assessmen   Cellulitis of abdominal wall 09/01/2016   Hypothyroid    Medical history non-contributory    Short cervix affecting pregnancy 05/23/2015   [facility]    06/23/14 ASCUS + HPV    Past Surgical History:  Procedure Laterality Date    ABDOMINOPLASTY  06/28/2016   [place]     CESAREAN SECTION     TUBAL LIGATION      Family History  Problem Relation Age of Onset   Alcohol abuse Mother    Drug abuse Father    Hypertension Father    Anxiety disorder Maternal Aunt    Cancer Maternal Grandmother        Cervical    Social History   Socioeconomic History   Marital status: Divorced    Spouse name: Not on file   Number of children: Not on file   Years of education: Not on file   Highest education level: Not on file  Occupational History   Not on file  Tobacco Use   Smoking status: Never   Smokeless tobacco: Never  Substance and Sexual Activity   Alcohol use: Yes    Comment: Social   Drug use: No   Sexual activity: Yes    Birth control/protection: Surgical  Other Topics Concern   Not on file  Social History Narrative   Not on file   Social Determinants of Health   Financial Resource Strain: Low Risk  (04/01/2021)   Overall Financial Resource Strain (CARDIA)    Difficulty of Paying Living Expenses: Not hard at all  Food Insecurity: No Food Insecurity (04/01/2021)   Hunger Vital Sign    Worried About Running Out of Food in the Last Year: Never true    Ran Out of Food in the Last Year: Never true  Transportation Needs: No Transportation Needs (04/01/2021)   PRAPARE - Hydrologist (Medical): No    Lack of Transportation (Non-Medical): No  Physical Activity: Insufficiently Active (04/01/2021)   Exercise Vital Sign    Days of Exercise per Week: 1 day    Minutes of Exercise per Session: 10 min  Stress: Stress Concern Present (04/01/2021)   [place]    Feeling of Stress : Rather much  Social Connections: Moderately Integrated (04/01/2021)   Social Connection and Isolation Panel [NHANES]    Frequency of Communication with Friends and Family: Twice a week    Frequency of Social Gatherings with Friends and  Family: Once a week    Attends Religious Services: More than 4 times per year    Active Member of [employer] or Organizations: No    Attends Archivist Meetings: Never    Marital Status: Living with partner  Intimate Partner Violence: Not At Risk (04/01/2021)   Humiliation, Afraid, Rape, and Kick questionnaire    Fear of Current or Ex-Partner: No    Emotionally Abused: No    Physically Abused: No    Sexually Abused: No    Outpatient Medications Prior to Visit  Medication Sig Dispense Refill   cholecalciferol (VITAMIN D3) 25 MCG (1000 UNIT) tablet Take 1,000 Units by mouth daily.     ibuprofen (ADVIL) 600 MG tablet Take 1 tablet (600 mg total) by mouth every 8 (eight) hours as needed. 30 tablet 1   levothyroxine (SYNTHROID) 25 MCG tablet Take 1 tablet (25 mcg total) by mouth daily before breakfast. 30 tablet 5   Magnesium 400 MG CAPS Take 400 mg by mouth daily. Taking for PVCs     tretinoin (RETIN-A) 0.025 % cream Apply topically at bedtime. 45 g 0   FLUoxetine (PROZAC) 20 MG capsule TAKE 1 CAPSULE BY MOUTH DAILY. 30 capsule 0   furosemide (LASIX) 20 MG tablet Take 1 tablet (20 mg total) by mouth daily. 10 tablet 0   metoprolol succinate (TOPROL-XL) 25 MG 24 hr tablet TAKE 1 TABLET(25 MG) BY MOUTH DAILY 90 tablet 3   No facility-administered medications prior to visit.    No Known Allergies  ROS Review of Systems  Constitutional:  Negative for chills and fever.  Respiratory:  Negative for cough and shortness of breath.   Cardiovascular:  Negative for chest pain and palpitations.  Gastrointestinal:  Negative for nausea and vomiting.  Genitourinary:  Negative for dysuria and hematuria.  Musculoskeletal:  Negative for neck pain and neck stiffness.  Skin:  Negative for rash.  Neurological:  Negative for dizziness and weakness.  Psychiatric/Behavioral:  Negative for agitation and behavioral problems.       Objective:    Physical Exam Vitals reviewed.  Constitutional:       General: She is not in acute distress.    Appearance: She is obese. She is not diaphoretic.  HENT:     Head: Normocephalic and atraumatic.     Nose: Nose normal.     Mouth/Throat:     Mouth: Mucous membranes are moist.  Eyes:     General: No scleral icterus.    Extraocular Movements: Extraocular movements intact.  Cardiovascular:  Rate and Rhythm: Normal rate and regular rhythm.     Pulses: Normal pulses.     Heart sounds: Normal heart sounds. No murmur heard. Pulmonary:     Breath sounds: Normal breath sounds. No wheezing or rales.  Abdominal:     Palpations: Abdomen is soft.     Tenderness: There is no abdominal tenderness.  Musculoskeletal:     Cervical back: Neck supple. No tenderness.     Right lower leg: No edema.     Left lower leg: No edema.  Skin:    General: Skin is warm.     Findings: Rash (Acneform - over face) present.  Neurological:     General: No focal deficit present.     Mental Status: She is alert and oriented to person, place, and time.  Psychiatric:        Mood and Affect: Mood normal. Mood is not anxious.        Behavior: Behavior normal.     BP 121/73 (BP Location: Right Arm, Patient Position: Sitting, Cuff Size: Normal)   Pulse 78   Ht _0  (1.6 m)   Wt 173 lb 9.6 oz (78.7 kg)   SpO2 97%   BMI 30.75 kg/m  Wt Readings from Last 3 Encounters:  06/28/22 173 lb 9.6 oz (78.7 kg)  05/11/22 176 lb (79.8 kg)  05/02/22 176 lb (79.8 kg)    Lab Results  Component Value Date   TSH 2.550 05/31/2021   Lab Results  Component Value Date   WBC 7.4 03/30/2021   HGB 13.8 03/30/2021   HCT 41.8 03/30/2021   MCV 95 03/30/2021   PLT 358 03/30/2021   Lab Results  Component Value Date   NA 139 03/30/2021   K 4.5 03/30/2021   CO2 23 03/30/2021   GLUCOSE 85 03/30/2021   BUN 8 03/30/2021   CREATININE 0.63 03/30/2021   BILITOT 0.3 03/30/2021   ALKPHOS 71 03/30/2021   AST 12 03/30/2021   ALT 9 03/30/2021   PROT 6.2 03/30/2021   ALBUMIN 4.2  03/30/2021   CALCIUM 8.8 03/30/2021   ANIONGAP 9 07/25/2017   EGFR 116 03/30/2021   Lab Results  Component Value Date   CHOL 201 (H) 03/30/2021   Lab Results  Component Value Date   HDL 64 03/30/2021   Lab Results  Component Value Date   LDLCALC 113 (H) 03/30/2021   Lab Results  Component Value Date   TRIG 140 03/30/2021   Lab Results  Component Value Date   CHOLHDL 3.1 03/30/2021   Lab Results  Component Value Date   HGBA1C 5.1 03/30/2021      Assessment & Plan:   Problem List Items Addressed This Visit       Endocrine   Hypothyroidism    Lab Results  Component Value Date   TSH 2.550 05/31/2021  On Levothyroxine 25 mcg QD Had weight gain and hair loss before starting Levothyroxine Check TSH and free T4        Other   Obsessive-compulsive disorder    Intrusive thoughts and acts history since childhood Periods of repetitive activities Has episodes of paranoia at times Better with Prozac 20 mg daily now      Relevant Medications   FLUoxetine (PROZAC) 20 MG capsule   Encounter for general adult medical examination with abnormal findings - Primary    Physical exam as documented. Counseling done  re healthy lifestyle involving commitment to 150 minutes exercise per week, heart healthy diet, and  attaining healthy weight.The importance of adequate sleep also discussed. Changes in health habits are decided on by the patient with goals and time frames  set for achieving them. Immunization and cancer screening needs are specifically addressed at this visit.      Obesity (BMI 30-39.9)    Diet modification and moderate exercise advised       Meds ordered this encounter  Medications   FLUoxetine (PROZAC) 20 MG capsule    Sig: Take 1 capsule (20 mg total) by mouth daily.    Dispense:  30 capsule    Refill:  5    Follow-up: Return in about 6 months (around 12/27/2022).    Lindell Spar, MD

## 2022-06-28 NOTE — Assessment & Plan Note (Signed)
Diet modification and moderate exercise advised. 

## 2022-06-28 NOTE — Assessment & Plan Note (Signed)
Lab Results  Component Value Date   TSH 2.550 05/31/2021   On Levothyroxine 25 mcg QD Had weight gain and hair loss before starting Levothyroxine Check TSH and free T4

## 2022-06-28 NOTE — Assessment & Plan Note (Addendum)

## 2022-06-29 LAB — LIPID PANEL
Chol/HDL Ratio: 2.5 ratio (ref 0.0–4.4)
Cholesterol, Total: 186 mg/dL (ref 100–199)
HDL: 74 mg/dL (ref >39)
LDL Chol Calc (NIH): 85 mg/dL (ref 0–99)
Triglycerides: 159 mg/dL — ABNORMAL HIGH (ref 0–149)
VLDL Cholesterol Cal: 27 mg/dL (ref 5–40)

## 2022-06-29 LAB — TSH+FREE T4
Free T4: 0.99 ng/dL (ref 0.82–1.77)
TSH: 2.27 u[IU]/mL (ref 0.450–4.500)

## 2022-06-29 LAB — CMP14+EGFR
ALT: 25 IU/L (ref 0–32)
AST: 19 IU/L (ref 0–40)
Albumin/Globulin Ratio: 2 (ref 1.2–2.2)
Albumin: 4.5 g/dL (ref 3.9–4.9)
Alkaline Phosphatase: 88 IU/L (ref 44–121)
BUN/Creatinine Ratio: 14 (ref 9–23)
BUN: 9 mg/dL (ref 6–20)
Bilirubin Total: <0.2 mg/dL (ref 0.0–1.2)
CO2: 24 mmol/L (ref 20–29)
Calcium: 9.5 mg/dL (ref 8.7–10.2)
Chloride: 106 mmol/L (ref 96–106)
Creatinine, Ser: 0.66 mg/dL (ref 0.57–1.00)
Globulin, Total: 2.2 g/dL (ref 1.5–4.5)
Glucose: 99 mg/dL (ref 70–99)
Potassium: 4.8 mmol/L (ref 3.5–5.2)
Sodium: 142 mmol/L (ref 134–144)
Total Protein: 6.7 g/dL (ref 6.0–8.5)
eGFR: 114 mL/min/{1.73_m2} (ref >59)

## 2022-06-29 LAB — CBC WITH DIFFERENTIAL/PLATELET
Basophils Absolute: 0.1 10*3/uL (ref 0.0–0.2)
Basos: 1 %
EOS (ABSOLUTE): 0.1 10*3/uL (ref 0.0–0.4)
Eos: 2 %
Hematocrit: 38 % (ref 34.0–46.6)
Hemoglobin: 12.6 g/dL (ref 11.1–15.9)
Immature Grans (Abs): 0 10*3/uL (ref 0.0–0.1)
Immature Granulocytes: 0 %
Lymphocytes Absolute: 2.5 10*3/uL (ref 0.7–3.1)
Lymphs: 28 %
MCH: 29.6 pg (ref 26.6–33.0)
MCHC: 33.2 g/dL (ref 31.5–35.7)
MCV: 89 fL (ref 79–97)
Monocytes Absolute: 1.1 10*3/uL — ABNORMAL HIGH (ref 0.1–0.9)
Monocytes: 12 %
Neutrophils Absolute: 5.1 10*3/uL (ref 1.4–7.0)
Neutrophils: 57 %
Platelets: 358 10*3/uL (ref 150–450)
RBC: 4.25 x10E6/uL (ref 3.77–5.28)
RDW: 12.8 % (ref 11.7–15.4)
WBC: 9 10*3/uL (ref 3.4–10.8)

## 2022-06-29 LAB — HEMOGLOBIN A1C
Est. average glucose Bld gHb Est-mCnc: 105 mg/dL
Hgb A1c MFr Bld: 5.3 % (ref 4.8–5.6)

## 2022-06-29 LAB — VITAMIN D 25 HYDROXY (VIT D DEFICIENCY, FRACTURES): Vit D, 25-Hydroxy: 30.1 ng/mL (ref 30.0–100.0)

## 2022-07-21 ENCOUNTER — Encounter: Payer: Self-pay | Admitting: Internal Medicine

## 2022-07-21 NOTE — Telephone Encounter (Signed)
scheduled

## 2022-07-27 ENCOUNTER — Encounter: Payer: Self-pay | Admitting: Internal Medicine

## 2022-07-27 ENCOUNTER — Ambulatory Visit (INDEPENDENT_AMBULATORY_CARE_PROVIDER_SITE_OTHER): Payer: PRIVATE HEALTH INSURANCE | Admitting: Internal Medicine

## 2022-07-27 VITALS — BP 127/76 | HR 79 | Ht 63.0 in | Wt 174.2 lb

## 2022-07-27 DIAGNOSIS — E669 Obesity, unspecified: Secondary | ICD-10-CM

## 2022-07-27 DIAGNOSIS — E039 Hypothyroidism, unspecified: Secondary | ICD-10-CM | POA: Diagnosis not present

## 2022-07-27 DIAGNOSIS — H1032 Unspecified acute conjunctivitis, left eye: Secondary | ICD-10-CM | POA: Diagnosis not present

## 2022-07-27 MED ORDER — OFLOXACIN 0.3 % OP SOLN: 1.0000 [drp] | Freq: Four times a day (QID) | OPHTHALMIC | 0 refills | Status: DC

## 2022-07-27 MED ORDER — PHENTERMINE HCL 15 MG PO CAPS: 15.0000 mg | ORAL_CAPSULE | ORAL | 0 refills | Status: DC

## 2022-07-27 NOTE — Assessment & Plan Note (Signed)
Lab Results  Component Value Date   TSH 2.270 06/28/2022   On Levothyroxine 25 mcg QD Had weight gain and hair loss before starting Levothyroxine Check TSH and free T4 

## 2022-07-27 NOTE — Progress Notes (Signed)
Established Patient Office Visit  Subjective:  Patient ID: Nicole Pollard, female    DOB: 11/11/1980  Age: 42 y.o. MRN: [id number]  CC:  Chief Complaint  Patient presents with   Weight Loss    Patient would like to discuss weight loss medication.  She said for two days now in the morning her left eye is stuck shut and red not itching    HPI Nicole Pollard is a 41 y.o. female with past medical history of hypothyroidism, OCD, PTSD and diastasis recti s/p surgery who presents for f/u of obesity.  Obesity: She has been trying to follow strict low-carb diet and has been exercising regularly during her training sessions for [facility] department.  She is having difficulty losing weight.  I had lengthy discussion about medical weight loss options, including stimulants, other oral options and GLP-1 agonist therapy.  She agrees to start with phentermine as being most cost effective option for now.  She also reports left eye redness, mucoid discharge and slight burning X 2 days.  She denies any itching of the left eye.  Denies any fever or chills.  Denies any visual disturbance.  Past Medical History:  Diagnosis Date   Abnormal Pap smear of cervix    12/15/14 - ASCUS + HPV   Anxiety 01/28/2015   Overview:  On zoloft 20m  EPDS 7 on 03/18/15 Mood stable  Last Assessment & Plan:  Pt states that her mood is stable on zoloft 587m  Anxiety 01/28/2015   Overview:  On zoloft 5017mEPDS 7 on 03/18/15 Mood stable  Last Assessment & Plan:  Pt states that her mood is stable on zoloft 45m29mormatting of this note might be different from the original. On zoloft 45mg45mDS 7 on 03/18/15 Mood stable  Overview:  Overview:  On zoloft 45mg 61mS 7 on 03/18/15 Mood stable  Last Assessment & Plan:  Pt states that her mood is stable on zoloft 45mg  29m Assessmen   Cellulitis of abdominal wall 09/01/2016   Hypothyroid    Medical history non-contributory    Short cervix affecting pregnancy 05/23/2015   [facility]    06/23/14 ASCUS + HPV    Past Surgical History:  Procedure Laterality Date   ABDOMINOPLASTY  06/28/2016   [place]   [place]     CESAREAN SECTION     TUBAL LIGATION      Family History  Problem Relation Age of Onset   Alcohol abuse Mother    Drug abuse Father    Hypertension Father    Anxiety disorder Maternal Aunt    Cancer Maternal Grandmother        Cervical    Social History   Socioeconomic History   Marital status: Divorced    Spouse name: Not on file   Number of children: Not on file   Years of education: Not on file   Highest education level: Not on file  Occupational History   Not on file  Tobacco Use   Smoking status: Never   Smokeless tobacco: Never  Substance and Sexual Activity   Alcohol use: Yes    Comment: Social   Drug use: No   Sexual activity: Yes    Birth control/protection: Surgical  Other Topics Concern   Not on file  Social History Narrative   Not on file   Social Determinants of Health   Financial Resource Strain:  Low Risk  (04/01/2021)   Overall Financial Resource Strain (CARDIA)    Difficulty of Paying Living Expenses: Not hard at all  Food Insecurity: No Food Insecurity (04/01/2021)   Hunger Vital Sign    Worried About Running Out of Food in the Last Year: Never true    Ran Out of Food in the Last Year: Never true  Transportation Needs: No Transportation Needs (04/01/2021)   PRAPARE - Hydrologist (Medical): No    Lack of Transportation (Non-Medical): No  Physical Activity: Insufficiently Active (04/01/2021)   Exercise Vital Sign    Days of Exercise per Week: 1 day    Minutes of Exercise per Session: 10 min  Stress: Stress Concern Present (04/01/2021)   Riva    Feeling of Stress : Rather much  Social Connections: Moderately Integrated (04/01/2021)   Social Connection and  Isolation Panel [NHANES]    Frequency of Communication with Friends and Family: Twice a week    Frequency of Social Gatherings with Friends and Family: Once a week    Attends Religious Services: More than 4 times per year    Active Member of [employer] or Organizations: No    Attends Archivist Meetings: Never    Marital Status: Living with partner  Intimate Partner Violence: Not At Risk (04/01/2021)   Humiliation, Afraid, Rape, and Kick questionnaire    Fear of Current or Ex-Partner: No    Emotionally Abused: No    Physically Abused: No    Sexually Abused: No    Outpatient Medications Prior to Visit  Medication Sig Dispense Refill   cholecalciferol (VITAMIN D3) 25 MCG (1000 UNIT) tablet Take 1,000 Units by mouth daily.     FLUoxetine (PROZAC) 20 MG capsule Take 1 capsule (20 mg total) by mouth daily. 30 capsule 5   ibuprofen (ADVIL) 600 MG tablet Take 1 tablet (600 mg total) by mouth every 8 (eight) hours as needed. 30 tablet 1   levothyroxine (SYNTHROID) 25 MCG tablet Take 1 tablet (25 mcg total) by mouth daily before breakfast. 30 tablet 5   Magnesium 400 MG CAPS Take 400 mg by mouth daily. Taking for PVCs     tretinoin (RETIN-A) 0.025 % cream Apply topically at bedtime. 45 g 0   No facility-administered medications prior to visit.    No Known Allergies  ROS Review of Systems  Constitutional:  Negative for chills and fever.  HENT:  Negative for congestion, postnasal drip, sinus pressure and sinus pain.   Eyes:  Positive for pain, discharge and redness. Negative for visual disturbance.  Respiratory:  Negative for cough and shortness of breath.   Cardiovascular:  Negative for chest pain and palpitations.  Gastrointestinal:  Negative for nausea and vomiting.  Genitourinary:  Negative for dysuria and hematuria.  Musculoskeletal:  Negative for neck pain and neck stiffness.  Skin:  Negative for rash.  Neurological:  Negative for dizziness and weakness.   Psychiatric/Behavioral:  Negative for agitation and behavioral problems.       Objective:    Physical Exam Vitals reviewed.  Constitutional:      General: She is not in acute distress.    Appearance: She is obese. She is not diaphoretic.  HENT:     Head: Normocephalic and atraumatic.     Nose: Nose normal.     Mouth/Throat:     Mouth: Mucous membranes are moist.  Eyes:     General: No  scleral icterus.       Left eye: Discharge present.    Extraocular Movements: Extraocular movements intact.     Conjunctiva/sclera:     Left eye: Chemosis present.  Cardiovascular:     Rate and Rhythm: Normal rate and regular rhythm.     Pulses: Normal pulses.     Heart sounds: Normal heart sounds. No murmur heard. Pulmonary:     Breath sounds: Normal breath sounds. No wheezing or rales.  Musculoskeletal:     Cervical back: Neck supple. No tenderness.     Right lower leg: No edema.     Left lower leg: No edema.  Skin:    General: Skin is warm.     Findings: Rash (Acneform - over face) present.  Neurological:     General: No focal deficit present.     Mental Status: She is alert and oriented to person, place, and time.  Psychiatric:        Mood and Affect: Mood normal. Mood is not anxious.        Behavior: Behavior normal.     BP 127/76 (BP Location: Left Arm, Patient Position: Sitting, Cuff Size: Normal)   Pulse 79   Ht _0  (1.6 m)   Wt 174 lb 3.2 oz (79 kg)   SpO2 96%   BMI 30.86 kg/m  Wt Readings from Last 3 Encounters:  07/27/22 174 lb 3.2 oz (79 kg)  06/28/22 173 lb 9.6 oz (78.7 kg)  05/11/22 176 lb (79.8 kg)    Lab Results  Component Value Date   TSH 2.270 06/28/2022   Lab Results  Component Value Date   WBC 9.0 06/28/2022   HGB 12.6 06/28/2022   HCT 38.0 06/28/2022   MCV 89 06/28/2022   PLT 358 06/28/2022   Lab Results  Component Value Date   NA 142 06/28/2022   K 4.8 06/28/2022   CO2 24 06/28/2022   GLUCOSE 99 06/28/2022   BUN 9 06/28/2022    CREATININE 0.66 06/28/2022   BILITOT <0.2 06/28/2022   ALKPHOS 88 06/28/2022   AST 19 06/28/2022   ALT 25 06/28/2022   PROT 6.7 06/28/2022   ALBUMIN 4.5 06/28/2022   CALCIUM 9.5 06/28/2022   ANIONGAP 9 07/25/2017   EGFR 114 06/28/2022   Lab Results  Component Value Date   CHOL 186 06/28/2022   Lab Results  Component Value Date   HDL 74 06/28/2022   Lab Results  Component Value Date   LDLCALC 85 06/28/2022   Lab Results  Component Value Date   TRIG 159 (H) 06/28/2022   Lab Results  Component Value Date   CHOLHDL 2.5 06/28/2022   Lab Results  Component Value Date   HGBA1C 5.3 06/28/2022      Assessment & Plan:   Problem List Items Addressed This Visit       Endocrine   Hypothyroidism    Lab Results  Component Value Date   TSH 2.270 06/28/2022  On Levothyroxine 25 mcg QD Had weight gain and hair loss before starting Levothyroxine Check TSH and free T4        Other   Obesity (BMI 30-39.9) - Primary    BMI Readings from Last 3 Encounters:  07/27/22 30.86 kg/m  06/28/22 30.75 kg/m  05/11/22 31.18 kg/m  Had lengthy discussion about medical weight loss options Started phentermine 15 mg QD GLP-1 agonist therapy could be better options, but cost/insurance coverage is a limiting factor Diet modification and moderate exercise advised  Relevant Medications   phentermine 15 MG capsule   Other Visit Diagnoses     Acute bacterial conjunctivitis of left eye     Left eye redness and mucoid discharge likely due to bacterial conjunctivitis Started Ocuflox   Relevant Medications   ofloxacin (OCUFLOX) 0.3 % ophthalmic solution       Meds ordered this encounter  Medications   ofloxacin (OCUFLOX) 0.3 % ophthalmic solution    Sig: Place 1 drop into the left eye 4 (four) times daily.    Dispense:  5 mL    Refill:  0   phentermine 15 MG capsule    Sig: Take 1 capsule (15 mg total) by mouth every morning.    Dispense:  30 capsule    Refill:  0     Follow-up: Return in about 2 months (around 09/27/2022) for Weight management.    Lindell Spar, MD

## 2022-07-27 NOTE — Patient Instructions (Signed)
Please start taking Phentermine as prescribed.  Please start applying Ofloxacin eye drops as prescribed.  Please continue to follow low carb diet and perform moderate exercise/walking at least 150 mins/week.

## 2022-07-27 NOTE — Assessment & Plan Note (Signed)
BMI Readings from Last 3 Encounters:  07/27/22 30.86 kg/m  06/28/22 30.75 kg/m  05/11/22 31.18 kg/m   Had lengthy discussion about medical weight loss options Started phentermine 15 mg QD GLP-1 agonist therapy could be better options, but cost/insurance coverage is a limiting factor Diet modification and moderate exercise advised

## 2022-08-02 ENCOUNTER — Encounter: Payer: Self-pay | Admitting: Surgery

## 2022-08-02 ENCOUNTER — Ambulatory Visit (INDEPENDENT_AMBULATORY_CARE_PROVIDER_SITE_OTHER): Payer: PRIVATE HEALTH INSURANCE | Admitting: Surgery

## 2022-08-02 ENCOUNTER — Other Ambulatory Visit: Payer: Self-pay

## 2022-08-02 VITALS — BP 121/81 | HR 76 | Temp 98.4°F | Resp 14 | Ht 63.0 in | Wt 174.0 lb

## 2022-08-02 DIAGNOSIS — M6208 Separation of muscle (nontraumatic), other site: Secondary | ICD-10-CM

## 2022-08-02 DIAGNOSIS — K439 Ventral hernia without obstruction or gangrene: Secondary | ICD-10-CM

## 2022-08-02 NOTE — Progress Notes (Signed)
[facility] History and Physical  Reason for Referral: Diastases recti versus umbilical hernia Referring Physician: Dr. Allena Katz  Chief Complaint   New Patient (Initial Visit)     Nicole Pollard is a 42 y.o. female.  HPI: Patient presents for evaluation of bulge near her umbilicus.  She believes that she has had an umbilical hernia for 8 years.  She always notes bulging but has never tried to reduce it because it hurts when she pushes on the area.  Otherwise though, this area does not bother her.  She denies any nausea or vomiting.  She is moving her bowels, though she has issues with chronic constipation.  Her past medical history significant for hypothyroidism.  Her surgical history significant for C-section in 2012, and appendectomy in 2013, and abdominoplasty in 2016 performed in [place], with a redo abdominoplasty in 2019, and a tubal ligation in 2021.  She denies use of tobacco products and illicit drugs.  She will occasionally drink alcohol.  She denies use of blood thinning medications.  Past Medical History:  Diagnosis Date   Abnormal Pap smear of cervix    12/15/14 - ASCUS + HPV   Anxiety 01/28/2015   Overview:  On zoloft 50mg   EPDS 7 on 03/18/15 Mood stable  Last Assessment & Plan:  Pt states that her mood is stable on zoloft 50mg    Anxiety 01/28/2015   Overview:  On zoloft 50mg   EPDS 7 on 03/18/15 Mood stable  Last Assessment & Plan:  Pt states that her mood is stable on zoloft 50mg   Formatting of this note might be different from the original. On zoloft 50mg   EPDS 7 on 03/18/15 Mood stable  Overview:  Overview:  On zoloft 50mg   EPDS 7 on 03/18/15 Mood stable  Last Assessment & Plan:  Pt states that her mood is stable on zoloft 50mg   Last Assessmen   Cellulitis of abdominal wall 09/01/2016   Hypothyroid    Medical history non-contributory    Short cervix affecting pregnancy 05/23/2015   Cerclage placed 04/03/15 at [facility]    Vaginal Pap smear, abnormal    06/23/14  ASCUS + HPV    Past Surgical History:  Procedure Laterality Date   ABDOMINOPLASTY  06/28/2016   [place], [place]   APPENDECTOMY     CESAREAN SECTION     TUBAL LIGATION      Family History  Problem Relation Age of Onset   Alcohol abuse Mother    Drug abuse Father    Hypertension Father    Anxiety disorder Maternal Aunt    Cancer Maternal Grandmother        Cervical    Social History   Tobacco Use   Smoking status: Never   Smokeless tobacco: Never  Substance Use Topics   Alcohol use: Yes    Comment: Social   Drug use: No    Medications: I have reviewed the patient's current medications. Allergies as of 08/02/2022   No Known Allergies      Medication List        Accurate as of August 02, 2022 12:24 PM. If you have any questions, ask your nurse or doctor.          STOP taking these medications    ofloxacin 0.3 % ophthalmic solution Commonly known as: Ocuflox Stopped by: Viona Hosking A Nickolis Diel, DO       TAKE these medications    cholecalciferol 25 MCG (1000 UNIT) tablet Commonly known as: VITAMIN D3 Take 1,000 Units  by mouth daily.   FLUoxetine 20 MG capsule Commonly known as: PROZAC Take 1 capsule (20 mg total) by mouth daily.   ibuprofen 600 MG tablet Commonly known as: ADVIL Take 1 tablet (600 mg total) by mouth every 8 (eight) hours as needed.   levothyroxine 25 MCG tablet Commonly known as: SYNTHROID Take 1 tablet (25 mcg total) by mouth daily before breakfast.   Magnesium 400 MG Caps Take 400 mg by mouth daily. Taking for PVCs   phentermine 15 MG capsule Take 1 capsule (15 mg total) by mouth every morning.   tretinoin 0.025 % cream Commonly known as: RETIN-A Apply topically at bedtime.         ROS:  Constitutional: negative for chills, fatigue, and fevers Eyes: negative for visual disturbance and pain Ears, nose, mouth, throat, and face: negative for ear drainage and sore throat Respiratory: negative for cough, wheezing, and  shortness of breath Cardiovascular: negative for chest pain and palpitations Gastrointestinal: positive for abdominal pain, negative for nausea, reflux symptoms, and vomiting Genitourinary:negative for dysuria and frequency Integument/breast: negative for dryness and rash Hematologic/lymphatic: negative for bleeding and lymphadenopathy Musculoskeletal:negative for back pain and neck pain Neurological: negative for dizziness and tremors Endocrine: negative for temperature intolerance  Blood pressure 121/81, pulse 76, temperature 98.4 F (36.9 C), temperature source Oral, resp. rate 14, height 5\' 3"  (1.6 m), weight 174 lb (78.9 kg), SpO2 96 %. Physical Exam Vitals reviewed.  Constitutional:      Appearance: Normal appearance.  HENT:     Head: Normocephalic and atraumatic.  Eyes:     Extraocular Movements: Extraocular movements intact.     Pupils: Pupils are equal, round, and reactive to light.  Cardiovascular:     Rate and Rhythm: Normal rate and regular rhythm.  Pulmonary:     Effort: Pulmonary effort is normal.     Breath sounds: Normal breath sounds.  Abdominal:     Comments: Abdomen soft, nondistended, no percussion tenderness, nontender to palpation; no rigidity, guarding, rebound tenderness; umbilical scarring consistent with previous abdominoplasty, possible umbilical defect measuring 1.5 cm, diastasis just superior to umbilicus  Musculoskeletal:        General: Normal range of motion.     Cervical back: Normal range of motion.  Skin:    General: Skin is warm and dry.  Neurological:     General: No focal deficit present.     Mental Status: She is alert and oriented to person, place, and time.  Psychiatric:        Mood and Affect: Mood normal.        Behavior: Behavior normal.     Results: No results found for this or any previous visit (from the past 48 hour(s)).  No results found.   Assessment & Plan:  Nicole Pollard is a 42 y.o. female who presents for evaluation  of umbilical hernia versus diastases.  -I explained the difference between an umbilical hernia and diastasis recti.  I further explained that the diastasis surgical repair is considered cosmetic. -On exam, I feel that she has potentially a small hernia defect at her umbilicus in addition to a small diastasis recti just superior to her umbilicus.  Given her significant amount of surgical history at the umbilicus, I would like to obtain a CT of the abdomen and pelvis to fully evaluate for hernia to verify I am not palpating scar tissue -The diastasis recti just above her umbilicus is what bothers the patient.  I explained that this is  something that could be repaired, but would likely need to be done by plastic surgeon -Will obtain the CT abdomen and pelvis first to evaluate for hernia -Further recommendations to follow imaging  All questions were answered to the satisfaction of the patient.  Theophilus Kinds, DO [facility] [address] [place], [place] [address] [phone] (office)

## 2022-08-09 ENCOUNTER — Ambulatory Visit (HOSPITAL_COMMUNITY): Payer: PRIVATE HEALTH INSURANCE

## 2022-08-24 ENCOUNTER — Other Ambulatory Visit: Payer: Self-pay | Admitting: Internal Medicine

## 2022-08-24 DIAGNOSIS — E669 Obesity, unspecified: Secondary | ICD-10-CM

## 2022-08-24 MED ORDER — PHENTERMINE HCL 15 MG PO CAPS: 15.0000 mg | ORAL_CAPSULE | ORAL | 0 refills | Status: DC

## 2022-09-19 ENCOUNTER — Encounter (HOSPITAL_COMMUNITY): Payer: Self-pay

## 2022-09-19 ENCOUNTER — Ambulatory Visit (HOSPITAL_COMMUNITY)
Admission: RE | Admit: 2022-09-19 | Discharge: 2022-09-19 | Disposition: A | Discharge status: Home / Self Care | Payer: PRIVATE HEALTH INSURANCE | Source: Ambulatory Visit | Attending: Surgery | Admitting: Surgery

## 2022-09-19 DIAGNOSIS — K439 Ventral hernia without obstruction or gangrene: Secondary | ICD-10-CM

## 2022-09-19 MED ORDER — IOHEXOL 300 MG/ML  SOLN
100.0000 mL | Freq: Once | INTRAMUSCULAR | Status: AC | PRN
  Administered 2022-09-19: 100 mL via INTRAVENOUS

## 2022-09-20 ENCOUNTER — Encounter: Payer: Self-pay | Admitting: Internal Medicine

## 2022-09-22 ENCOUNTER — Telehealth (INDEPENDENT_AMBULATORY_CARE_PROVIDER_SITE_OTHER): Payer: PRIVATE HEALTH INSURANCE | Admitting: Surgery

## 2022-09-22 DIAGNOSIS — M6208 Separation of muscle (nontraumatic), other site: Secondary | ICD-10-CM

## 2022-09-22 NOTE — Telephone Encounter (Signed)
[facility]  Called and spoke with the patient about the results of her CT scan.  She does not have a ventral hernia, and there would be no indication for surgery from my standpoint.  Her CT scan did demonstrate diastasis recti, which can be improved with abdominal exercises or can be surgically repaired by a plastic surgeon.  At this time, she does not want a referral to a Psychiatric nurse.  I advised her to call our office if she would like [place] to send in a referral for her.  All questions answered to her expressed satisfaction.  CT abdomen and pelvis (09/19/22): Other: Postsurgical changes of the anterior abdominal wall. No fluid collection or abscess. There is diastasis of anterior abdominal wall musculature in the midline. No hernia.   IMPRESSION: 1. No acute intra-abdominal or pelvic pathology.  No ventral hernia. 2. Cholelithiasis. 3. A 3 mm nonobstructing right renal inferior pole calculus. No hydronephrosis.  Graciella Freer, DO [facility] [address] [place], [place] [address] [phone] (office)

## 2022-09-28 ENCOUNTER — Ambulatory Visit (INDEPENDENT_AMBULATORY_CARE_PROVIDER_SITE_OTHER): Payer: PRIVATE HEALTH INSURANCE | Admitting: Internal Medicine

## 2022-09-28 ENCOUNTER — Encounter: Payer: Self-pay | Admitting: Internal Medicine

## 2022-09-28 VITALS — BP 138/85 | HR 85 | Ht 63.0 in | Wt 173.0 lb

## 2022-09-28 DIAGNOSIS — F429 Obsessive-compulsive disorder, unspecified: Secondary | ICD-10-CM

## 2022-09-28 DIAGNOSIS — E669 Obesity, unspecified: Secondary | ICD-10-CM

## 2022-09-28 DIAGNOSIS — E039 Hypothyroidism, unspecified: Secondary | ICD-10-CM | POA: Diagnosis not present

## 2022-09-28 MED ORDER — PHENTERMINE HCL 37.5 MG PO TABS: 37.5000 mg | ORAL_TABLET | Freq: Every day | ORAL | 3 refills | Status: DC

## 2022-09-28 NOTE — Progress Notes (Addendum)
Established Patient Office Visit  Subjective:  Patient ID: Nicole Pollard, female    DOB: 1980/10/30  Age: 42 y.o. MRN: [id number]  CC:  Chief Complaint  Patient presents with   Weight Management Screening    Two month follow up on weight management    HPI Nicole Pollard is a 42 y.o. female with past medical history of hypothyroidism, OCD, PTSD and diastasis recti s/p surgery who presents for f/u of obesity.  Obesity: She has been trying to follow strict low-carb diet and has been exercising regularly during her training sessions for [facility] [employer] department.  She is having difficulty losing weight.  I had lengthy discussion about medical weight loss options, including stimulants, other oral options and GLP-1 agonist therapy in the last visit.  She agreed to start with phentermine as being most cost effective option. She has noticed decreased appetite, but her weight has been stable. She is currently preparing for police department training. She has noticed increased energy levels and improved mood with Phentermine as well.   OCD and PTSD: She has been feeling better with Prozac now. Denies episodes of anxiety and anhedonia at times.  She currently denies any SI or HI.  She is able to focus better with Prozac now.    Past Medical History:  Diagnosis Date   Abnormal Pap smear of cervix    12/15/14 - ASCUS + HPV   Anxiety 01/28/2015   Overview:  On zoloft '50mg'$   EPDS 7 on 03/18/15 Mood stable  Last Assessment & Plan:  Pt states that her mood is stable on zoloft '50mg'$    Anxiety 01/28/2015   Overview:  On zoloft '50mg'$   EPDS 7 on 03/18/15 Mood stable  Last Assessment & Plan:  Pt states that her mood is stable on zoloft '50mg'$   Formatting of this note might be different from the original. On zoloft '50mg'$   EPDS 7 on 03/18/15 Mood stable  Overview:  Overview:  On zoloft '50mg'$   EPDS 7 on 03/18/15 Mood stable  Last Assessment & Plan:  Pt states that her mood is stable on zoloft '50mg'$   Last Assessmen   Cellulitis of  abdominal wall 09/01/2016   Hypothyroid    Medical history non-contributory    Short cervix affecting pregnancy 05/23/2015   Cerclage [facility]/03/15 at [facility]    Vaginal Pap smear, abnormal    06/23/14 ASCUS + HPV    Past Surgical History:  Procedure Laterality Date   ABDOMINOPLASTY  06/28/2016   [place], [place]   APPENDECTOMY     CESAREAN SECTION     TUBAL LIGATION      Family History  Problem Relation Age of Onset   Alcohol abuse Mother    Drug abuse Father    Hypertension Father    Anxiety disorder Maternal Aunt    Cancer Maternal Grandmother        Cervical    Social History   Socioeconomic History   Marital status: Divorced    Spouse name: Not on file   Number of children: Not on file   Years of education: Not on file   Highest education level: Not on file  Occupational History   Not on file  Tobacco Use   Smoking status: Never   Smokeless tobacco: Never  Substance and Sexual Activity   Alcohol use: Yes    Comment: Social   Drug use: No   Sexual activity: Yes    Birth control/protection: Surgical  Other Topics Concern   Not on file  Social History Narrative   Not on file   Social Determinants of Health   Financial Resource Strain: Low Risk  (04/01/2021)   Overall Financial Resource Strain (CARDIA)    Difficulty of Paying Living Expenses: Not hard at all  Food Insecurity: No Food Insecurity (04/01/2021)   Hunger Vital Sign    Worried About Running Out of Food in the Last Year: Never true    [place] in the Last Year: Never true  Transportation Needs: No Transportation Needs (04/01/2021)   PRAPARE - Hydrologist (Medical): No    Lack of Transportation (Non-Medical): No  Physical Activity: Insufficiently Active (04/01/2021)   Exercise Vital Sign    Days of Exercise per Week: 1 day    Minutes of Exercise per Session: 10 min  Stress: Stress Concern Present (04/01/2021)   [place]    Feeling of Stress : Rather much  Social Connections: Moderately Integrated (04/01/2021)   Social Connection and Isolation Panel [NHANES]    Frequency of Communication with Friends and Family: Twice a week    Frequency of Social Gatherings with Friends and Family: Once a week    Attends Religious Services: More than 4 times per year    Active Member of [employer] or Organizations: No    Attends Archivist Meetings: Never    Marital Status: Living with partner  Intimate Partner Violence: Not At Risk (04/01/2021)   Humiliation, Afraid, Rape, and Kick questionnaire    Fear of Current or Ex-Partner: No    Emotionally Abused: No    Physically Abused: No    Sexually Abused: No    Outpatient Medications Prior to Visit  Medication Sig Dispense Refill   cholecalciferol (VITAMIN D3) 25 MCG (1000 UNIT) tablet Take 1,000 Units by mouth daily.     FLUoxetine (PROZAC) 20 MG capsule Take 1 capsule (20 mg total) by mouth daily. 30 capsule 5   levothyroxine (SYNTHROID) 25 MCG tablet Take 1 tablet (25 mcg total) by mouth daily before breakfast. 30 tablet 5   Magnesium 400 MG CAPS Take 400 mg by mouth daily. Taking for PVCs     tretinoin (RETIN-A) 0.025 % cream Apply topically at bedtime. 45 g 0   ibuprofen (ADVIL) 600 MG tablet Take 1 tablet (600 mg total) by mouth every 8 (eight) hours as needed. 30 tablet 1   phentermine 15 MG capsule Take 1 capsule (15 mg total) by mouth every morning. 30 capsule 0   No facility-administered medications prior to visit.    No Known Allergies  ROS Review of Systems  Constitutional:  Negative for chills and fever.  HENT:  Negative for congestion, postnasal drip, sinus pressure and sinus pain.   Eyes:  Negative for pain and discharge.  Respiratory:  Negative for cough and shortness of breath.   Cardiovascular:  Negative for chest pain and palpitations.  Gastrointestinal:  Negative for nausea and vomiting.  Genitourinary:   Negative for dysuria and hematuria.  Musculoskeletal:  Negative for neck pain and neck stiffness.  Skin:  Negative for rash.  Neurological:  Negative for dizziness and weakness.  Psychiatric/Behavioral:  Negative for agitation and behavioral problems.       Objective:    Physical Exam Vitals reviewed.  Constitutional:      General: She is not in acute distress.    Appearance: She is obese. She is not diaphoretic.  HENT:  Head: Normocephalic and atraumatic.     Nose: Nose normal.     Mouth/Throat:     Mouth: Mucous membranes are moist.  Eyes:     General: No scleral icterus.    Extraocular Movements: Extraocular movements intact.  Cardiovascular:     Rate and Rhythm: Normal rate and regular rhythm.     Pulses: Normal pulses.     Heart sounds: Normal heart sounds. No murmur heard. Pulmonary:     Breath sounds: Normal breath sounds. No wheezing or rales.  Musculoskeletal:     Cervical back: Neck supple. No tenderness.     Right lower leg: No edema.     Left lower leg: No edema.  Skin:    General: Skin is warm.     Findings: Rash (Acneform - over face) present.  Neurological:     General: No focal deficit present.     Mental Status: She is alert and oriented to person, place, and time.  Psychiatric:        Mood and Affect: Mood normal. Mood is not anxious.        Behavior: Behavior normal.     BP 138/85 (BP Location: Right Arm, Patient Position: Sitting, Cuff Size: Normal)   Pulse 85   Ht '5\' 3"'$  (1.6 m)   Wt 173 lb (78.5 kg)   LMP 09/05/2022   SpO2 97%   BMI 30.65 kg/m  Wt Readings from Last 3 Encounters:  09/28/22 173 lb (78.5 kg)  08/02/22 174 lb (78.9 kg)  07/27/22 174 lb 3.2 oz (79 kg)    Lab Results  Component Value Date   TSH 2.270 06/28/2022   Lab Results  Component Value Date   WBC 9.0 06/28/2022   HGB 12.6 06/28/2022   HCT 38.0 06/28/2022   MCV 89 06/28/2022   PLT 358 06/28/2022   Lab Results  Component Value Date   NA 142 06/28/2022    K 4.8 06/28/2022   CO2 24 06/28/2022   GLUCOSE 99 06/28/2022   BUN 9 06/28/2022   CREATININE 0.66 06/28/2022   BILITOT <0.2 06/28/2022   ALKPHOS 88 06/28/2022   AST 19 06/28/2022   ALT 25 06/28/2022   PROT 6.7 06/28/2022   ALBUMIN 4.5 06/28/2022   CALCIUM 9.5 06/28/2022   ANIONGAP 9 07/25/2017   EGFR 114 06/28/2022   Lab Results  Component Value Date   CHOL 186 06/28/2022   Lab Results  Component Value Date   HDL 74 06/28/2022   Lab Results  Component Value Date   LDLCALC 85 06/28/2022   Lab Results  Component Value Date   TRIG 159 (H) 06/28/2022   Lab Results  Component Value Date   CHOLHDL 2.5 06/28/2022   Lab Results  Component Value Date   HGBA1C 5.3 06/28/2022      Assessment & Plan:   Problem List Items Addressed This Visit    Hypothyroidism Lab Results  Component Value Date   TSH 2.270 06/28/2022   On Levothyroxine 25 mcg QD Had weight gain and hair loss before starting Levothyroxine Check TSH and free T4  Obsessive-compulsive disorder Intrusive thoughts and acts history since childhood Periods of repetitive activities Has episodes of paranoia at times Better with Prozac 20 mg daily now  Obesity (BMI 30-39.9) BMI Readings from Last 3 Encounters:  09/28/22 30.65 kg/m  08/02/22 30.82 kg/m  07/27/22 30.86 kg/m   Had lengthy discussion about medical weight loss options Had started phentermine 15 mg QD, increased dose to 37.5  mg QD as she has noticed decreased appetite GLP-1 agonist therapy could be better options, but cost/insurance coverage is a limiting factor Diet modification and moderate exercise advised  Meds ordered this encounter  Medications   phentermine (ADIPEX-P) 37.5 MG tablet    Sig: Take 1 tablet (37.5 mg total) by mouth daily before breakfast.    Dispense:  30 tablet    Refill:  3    Follow-up: Return in about 3 months (around 12/27/2022) for Weight management.    Lindell Spar, MD

## 2022-09-28 NOTE — Patient Instructions (Addendum)
Please start taking Phentermine 37.5 mg once daily instead of 15 mg.  Please continue to follow low carb diet and moderate exercise/walking at least 150 mins/week.  Please get blood tests done before the next visit.

## 2022-09-28 NOTE — Assessment & Plan Note (Signed)
BMI Readings from Last 3 Encounters:  09/28/22 30.65 kg/m  08/02/22 30.82 kg/m  07/27/22 30.86 kg/m   Had lengthy discussion about medical weight loss options Had started phentermine 15 mg QD, increased dose to 37.5 mg QD as she has noticed decreased appetite GLP-1 agonist therapy could be better options, but cost/insurance coverage is a limiting factor Diet modification and moderate exercise advised

## 2022-09-28 NOTE — Assessment & Plan Note (Signed)
Intrusive thoughts and acts history since childhood Periods of repetitive activities Has episodes of paranoia at times Better with Prozac 20 mg daily now

## 2022-09-28 NOTE — Assessment & Plan Note (Signed)
Lab Results  Component Value Date   TSH 2.270 06/28/2022   On Levothyroxine 25 mcg QD Had weight gain and hair loss before starting Levothyroxine Check TSH and free T4

## 2022-09-29 ENCOUNTER — Encounter: Payer: Self-pay | Admitting: Radiology

## 2022-10-19 ENCOUNTER — Encounter: Payer: Self-pay | Admitting: Internal Medicine

## 2022-10-19 ENCOUNTER — Telehealth (INDEPENDENT_AMBULATORY_CARE_PROVIDER_SITE_OTHER): Payer: PRIVATE HEALTH INSURANCE | Admitting: Internal Medicine

## 2022-10-19 DIAGNOSIS — W57XXXA Bitten or stung by nonvenomous insect and other nonvenomous arthropods, initial encounter: Secondary | ICD-10-CM

## 2022-10-19 DIAGNOSIS — S30860A Insect bite (nonvenomous) of lower back and pelvis, initial encounter: Secondary | ICD-10-CM | POA: Diagnosis not present

## 2022-10-19 MED ORDER — DOXYCYCLINE HYCLATE 100 MG PO TABS: 100.0000 mg | ORAL_TABLET | Freq: Two times a day (BID) | ORAL | 0 refills | Status: DC

## 2022-10-19 NOTE — Telephone Encounter (Signed)
scheduled

## 2022-10-19 NOTE — Assessment & Plan Note (Signed)
Rash seems like erythema migrans Started empiric doxycycline Can apply cortisone cream as needed for itching Keep area clean and dry

## 2022-10-19 NOTE — Progress Notes (Signed)
Virtual Visit via Video Note   Because of Nicole Pollard's co-morbid illnesses, she is at least at moderate risk for complications without adequate follow up.  This format is felt to be most appropriate for this patient at this time.  All issues noted in this document were discussed and addressed.  A limited physical exam was performed with this format.      Evaluation Performed:  Follow-up visit  Date:  10/19/2022   ID:  [name], [name] 1980/11/08, MRN [id number]  Patient Location: Home Provider Location: Office/Clinic  Participants: Patient Location of Patient: Home Location of Provider: Telehealth Consent was obtain for visit to be over via telehealth. I verified that I am speaking with the correct person using two identifiers.  PCP:  Lindell Spar, MD   Chief Complaint: Tick bite  History of Present Illness:    Nicole Pollard is a 42 y.o. female who has a video visit for complaint of tick bite on lower back area.  She does not recall how long the tick was attached.  She was having itching sensation in her lower back, and felt a bump while taking a shower.  She tried to itch hard and a tick take out.  She has shared picture of the tick and the rash via MyChart.  She denies any fever, chills, nausea or vomiting currently.  The patient does not have symptoms concerning for COVID-19 infection (fever, chills, cough, or new shortness of breath).   Past Medical, Surgical, Social History, Allergies, and Medications have been Reviewed.  Past Medical History:  Diagnosis Date   Abnormal Pap smear of cervix    12/15/14 - ASCUS + HPV   Anxiety 01/28/2015   Overview:  On zoloft 50mg   EPDS 7 on 03/18/15 Mood stable  Last Assessment & Plan:  Pt states that her mood is stable on zoloft 50mg    Anxiety 01/28/2015   Overview:  On zoloft 50mg   EPDS 7 on 03/18/15 Mood stable  Last Assessment & Plan:  Pt states that her mood is stable on zoloft 50mg   Formatting of this note might be different  from the original. On zoloft 50mg   EPDS 7 on 03/18/15 Mood stable  Overview:  Overview:  On zoloft 50mg   EPDS 7 on 03/18/15 Mood stable  Last Assessment & Plan:  Pt states that her mood is stable on zoloft 50mg   Last Assessmen   Cellulitis of abdominal wall 09/01/2016   Hypothyroid    Medical history non-contributory    Short cervix affecting pregnancy 05/23/2015   C[facility]clage [facility]/03/[facility] at [facility]    Vaginal Pap smear, abnormal    06/23/14 ASCUS + HPV   Past Surgical History:  Procedure Laterality Date   ABDOMINOPLASTY  06/28/2016   [place], [place]   APPENDECTOMY     CESAREAN SECTION     TUBAL LIGATION       Current Meds  Medication Sig   doxycycline (VIBRA-TABS) 100 MG tablet Take 1 tablet (100 mg total) by mouth 2 (two) times daily.     Allergies:   Patient has no known allergies.   ROS:   Please see the history of present illness.     All other systems reviewed and are negative.   Labs/Other Tests and Data Reviewed:    Recent Labs: 06/28/2022: ALT 25; BUN 9; Creatinine, Ser 0.66; Hemoglobin 12.6; Platelets 358; Potassium 4.8; Sodium 142; TSH 2.270   Recent Lipid Panel Lab Results  Component Value Date/Time  CHOL 186 06/28/2022 04:31 PM   TRIG 159 (H) 06/28/2022 04:31 PM   HDL 74 06/28/2022 04:31 PM   CHOLHDL 2.5 06/28/2022 04:31 PM   LDLCALC 85 06/28/2022 04:31 PM    Wt Readings from Last 3 Encounters:  09/28/22 173 lb (78.5 kg)  08/02/22 174 lb (78.9 kg)  07/27/22 174 lb 3.2 oz (79 kg)     Objective:    Vital Signs:  LMP 09/05/2022    VITAL SIGNS:  reviewed GEN:  no acute distress EYES:  sclerae anicteric, EOMI - Extraocular Movements Intact SKIN:  Erythematous rash with central clearing on the lower back area NEURO:  alert and oriented x 3, no obvious focal deficit PSYCH:  normal affect  ASSESSMENT & PLAN:    Tick bite of lower back Rash seems like erythema migrans Started empiric doxycycline Can apply cortisone cream as needed for  itching Keep area clean and dry    I discussed the assessment and treatment plan with the patient. The patient was provided an opportunity to ask questions, and all were answered. The patient agreed with the plan and demonstrated an understanding of the instructions.   The patient was advised to call back or seek an in-person evaluation if the symptoms worsen or if the condition fails to improve as anticipated.  The above assessment and management plan was discussed with the patient. The patient verbalized understanding of and has agreed to the management plan.   Medication Adjustments/Labs and Tests Ordered: Current medicines are reviewed at length with the patient today.  Concerns regarding medicines are outlined above.   Tests Ordered: No orders of the defined types were placed in this encounter.   Medication Changes: Meds ordered this encounter  Medications   doxycycline (VIBRA-TABS) 100 MG tablet    Sig: Take 1 tablet (100 mg total) by mouth 2 (two) times daily.    Dispense:  20 tablet    Refill:  0     Note: This dictation was prepared with Dragon dictation along with smaller phrase technology. Similar sounding words can be transcribed inadequately or may not be corrected upon review. Any transcriptional errors that result from this process are unintentional.      Disposition:  Follow up  Signed, Lindell Spar, MD  10/19/2022 4:41 PM     [facility]

## 2022-12-27 ENCOUNTER — Ambulatory Visit: Payer: PRIVATE HEALTH INSURANCE | Admitting: Internal Medicine

## 2023-01-05 ENCOUNTER — Ambulatory Visit: Payer: PRIVATE HEALTH INSURANCE | Admitting: Internal Medicine

## 2023-02-16 ENCOUNTER — Other Ambulatory Visit: Payer: Self-pay | Admitting: Internal Medicine

## 2023-02-16 DIAGNOSIS — E039 Hypothyroidism, unspecified: Secondary | ICD-10-CM

## 2023-02-16 DIAGNOSIS — F429 Obsessive-compulsive disorder, unspecified: Secondary | ICD-10-CM

## 2023-02-17 ENCOUNTER — Other Ambulatory Visit: Payer: Self-pay | Admitting: Internal Medicine

## 2023-02-17 DIAGNOSIS — E039 Hypothyroidism, unspecified: Secondary | ICD-10-CM

## 2023-02-17 DIAGNOSIS — F429 Obsessive-compulsive disorder, unspecified: Secondary | ICD-10-CM

## 2023-02-22 ENCOUNTER — Ambulatory Visit (INDEPENDENT_AMBULATORY_CARE_PROVIDER_SITE_OTHER): Payer: PRIVATE HEALTH INSURANCE | Admitting: Internal Medicine

## 2023-02-22 ENCOUNTER — Encounter: Payer: Self-pay | Admitting: Internal Medicine

## 2023-02-22 VITALS — BP 135/84 | HR 99 | Ht 63.0 in | Wt 170.6 lb

## 2023-02-22 DIAGNOSIS — E669 Obesity, unspecified: Secondary | ICD-10-CM

## 2023-02-22 DIAGNOSIS — F429 Obsessive-compulsive disorder, unspecified: Secondary | ICD-10-CM | POA: Diagnosis not present

## 2023-02-22 DIAGNOSIS — E039 Hypothyroidism, unspecified: Secondary | ICD-10-CM | POA: Diagnosis not present

## 2023-02-22 MED ORDER — FLUOXETINE HCL 20 MG PO CAPS: 20.0000 mg | ORAL_CAPSULE | Freq: Every day | ORAL | 3 refills | Status: DC

## 2023-02-22 MED ORDER — LEVOTHYROXINE SODIUM 25 MCG PO TABS: 25.0000 ug | ORAL_TABLET | Freq: Every day | ORAL | 3 refills | Status: DC

## 2023-02-22 NOTE — Progress Notes (Signed)
Established Patient Office Visit  Subjective:  Patient ID: Nicole Pollard, female    DOB: 1980/08/18  Age: 42 y.o. MRN: [id number]  CC:  Chief Complaint  Patient presents with   Medication Refill    Refills on fluoxetine and levothyroxine    HPI Nicole Pollard is a 42 y.o. female with past medical history of hypothyroidism, OCD, PTSD and diastasis recti s/p surgery who presents for f/u of obesity.  Obesity: She has been trying to follow strict low-carb diet and has been exercising regularly. She was having difficulty losing weight, but has started losing weight following vegetarian diet for the last 2 weeks. I had lengthy discussion about medical weight loss options, including stimulants, other oral options and GLP-1 agonist therapy in the last visit.  Has stopped taking phentermine as it was not helping with weight loss.  OCD and PTSD: She has been feeling better with Prozac now. Denies episodes of anxiety and anhedonia at times.  She currently denies any SI or HI.  She is able to focus better with Prozac now.   Hypothyroidism: She takes levothyroxine 25 mcg QD.  Reports that she had stopped taking it in between, but has started taking it regularly now.  Denies any recent change in appetite, worsening of fatigue or leg swelling.  Past Medical History:  Diagnosis Date   Abnormal Pap smear of cervix    12/15/14 - ASCUS + HPV   Anxiety 01/28/2015   Overview:  On zoloft 50mg   EPDS 7 on 03/18/15 Mood stable  Last Assessment & Plan:  Pt states that her mood is stable on zoloft 50mg    Anxiety 01/28/2015   Overview:  On zoloft 50mg   EPDS 7 on 03/18/15 Mood stable  Last Assessment & Plan:  Pt states that her mood is stable on zoloft 50mg   Formatting of this note might be different from the original. On zoloft 50mg   EPDS 7 on 03/18/15 Mood stable  Overview:  Overview:  On zoloft 50mg   EPDS 7 on 03/18/15 Mood stable  Last Assessment & Plan:  Pt states that her mood is stable on zoloft 50mg   Last  Assessmen   Cellulitis of abdominal wall 09/01/2016   Hypothyroid    Medical history non-contributory    Short cervix affecting pregnancy 05/23/2015   C[facility]clage [facility]/03/15 [facility]    Vaginal Pap smear, abnormal    06/23/14 ASCUS + HPV    Past Surgical History:  Procedure Laterality Date   ABDOMINOPLASTY  06/28/2016   [place], [place]   APPENDECTOMY     CESAREAN SECTION     TUBAL LIGATION      Family History  Problem Relation Age of Onset   Alcohol abuse Mother    Drug abuse Father    Hypertension Father    Anxiety disorder Maternal Aunt    Cancer Maternal Grandmother        Cervical    Social History   Socioeconomic History   Marital status: Divorced    Spouse name: Not on file   Number of children: Not on file   Years of education: Not on file   Highest education level: Not on file  Occupational History   Not on file  Tobacco Use   Smoking status: Never   Smokeless tobacco: Never  Substance and Sexual Activity   Alcohol use: Yes    Comment: Social   Drug use: No   Sexual activity: Yes    Birth control/protection: Surgical  Other Topics Concern  Not on file  Social History Narrative   Not on file   Social Determinants of Health   Financial Resource Strain: Low Risk  (04/01/2021)   Overall Financial Resource Strain (CARDIA)    Difficulty of Paying Living Expenses: Not hard at all  Food Insecurity: No Food Insecurity (04/01/2021)   Hunger Vital Sign    Worried About Running Out of Food in the Last Year: Never true    Ran Out of Food in the Last Year: Never true  Transportation Needs: No Transportation Needs (04/01/2021)   PRAPARE - Administrator, Civil Service (Medical): No    Lack of Transportation (Non-Medical): No  Physical Activity: Insufficiently Active (04/01/2021)   Exercise Vital Sign    Days of Exercise per Week: 1 day    Minutes of Exercise per Session: 10 min  Stress: Stress Concern Present (04/01/2021)   [employer] of  Occupational Health - Occupational Stress Questionnaire    Feeling of Stress : Rather much  Social Connections: Moderately Integrated (04/01/2021)   Social Connection and Isolation Panel [NHANES]    Frequency of Communication with Friends and Family: Twice a week    Frequency of Social Gatherings with Friends and Family: Once a week    Attends Religious Services: More than 4 times per year    Active Member of [employer] or Organizations: No    Attends Banker Meetings: Never    Marital Status: Living with partner  Intimate Partner Violence: Not At Risk (04/01/2021)   Humiliation, Afraid, Rape, and Kick questionnaire    Fear of Current or Ex-Partner: No    Emotionally Abused: No    Physically Abused: No    Sexually Abused: No    Outpatient Medications Prior to Visit  Medication Sig Dispense Refill   cholecalciferol (VITAMIN D3) 25 MCG (1000 UNIT) tablet Take 1,000 Units by mouth daily.     Magnesium 400 MG CAPS Take 400 mg by mouth daily. Taking for PVCs     tretinoin (RETIN-A) 0.025 % cream Apply topically at bedtime. 45 g 0   doxycycline (VIBRA-TABS) 100 MG tablet Take 1 tablet (100 mg total) by mouth 2 (two) times daily. 20 tablet 0   FLUoxetine (PROZAC) 20 MG capsule TAKE 1 CAPSULE BY MOUTH DAILY. 30 capsule 0   levothyroxine (SYNTHROID) 25 MCG tablet TAKE 1 TABLET BY MOUTH ONCE DAILY WITH BREAKFAST. 30 tablet 0   phentermine (ADIPEX-P) 37.5 MG tablet Take 1 tablet (37.5 mg total) by mouth daily before breakfast. 30 tablet 3   No facility-administered medications prior to visit.    No Known Allergies  ROS Review of Systems  Constitutional:  Negative for chills and fever.  HENT:  Negative for congestion, postnasal drip, sinus pressure and sinus pain.   Eyes:  Negative for pain and discharge.  Respiratory:  Negative for cough and shortness of breath.   Cardiovascular:  Negative for chest pain and palpitations.  Gastrointestinal:  Negative for nausea and vomiting.   Genitourinary:  Negative for dysuria and hematuria.  Musculoskeletal:  Negative for neck pain and neck stiffness.  Skin:  Negative for rash.  Neurological:  Negative for dizziness and weakness.  Psychiatric/Behavioral:  Negative for agitation and behavioral problems.       Objective:    Physical Exam Vitals reviewed.  Constitutional:      General: She is not in acute distress.    Appearance: She is obese. She is not diaphoretic.  HENT:     Head:  Normocephalic and atraumatic.     Nose: Nose normal.     Mouth/Throat:     Mouth: Mucous membranes are moist.  Eyes:     General: No scleral icterus.    Extraocular Movements: Extraocular movements intact.  Cardiovascular:     Rate and Rhythm: Normal rate and regular rhythm.     Pulses: Normal pulses.     Heart sounds: Normal heart sounds. No murmur heard. Pulmonary:     Breath sounds: Normal breath sounds. No wheezing or rales.  Musculoskeletal:     Cervical back: Neck supple. No tenderness.     Right lower leg: No edema.     Left lower leg: No edema.  Skin:    General: Skin is warm.     Findings: No rash.  Neurological:     General: No focal deficit present.     Mental Status: She is alert and oriented to person, place, and time.  Psychiatric:        Mood and Affect: Mood normal. Mood is not anxious.        Behavior: Behavior normal.     BP 135/84 (BP Location: Right Arm, Patient Position: Sitting, Cuff Size: Normal)   Pulse 99   Ht 5\' 3"  (1.6 m)   Wt 170 lb 9.6 oz (77.4 kg)   SpO2 93%   BMI 30.22 kg/m  Wt Readings from Last 3 Encounters:  02/22/23 170 lb 9.6 oz (77.4 kg)  09/28/22 173 lb (78.5 kg)  08/02/22 174 lb (78.9 kg)    Lab Results  Component Value Date   TSH 2.270 06/28/2022   Lab Results  Component Value Date   WBC 9.0 06/28/2022   HGB 12.6 06/28/2022   HCT 38.0 06/28/2022   MCV 89 06/28/2022   PLT 358 06/28/2022   Lab Results  Component Value Date   NA 142 06/28/2022   K 4.8 06/28/2022    CO2 24 06/28/2022   GLUCOSE 99 06/28/2022   BUN 9 06/28/2022   CREATININE 0.66 06/28/2022   BILITOT <0.2 06/28/2022   ALKPHOS 88 06/28/2022   AST 19 06/28/2022   ALT 25 06/28/2022   PROT 6.7 06/28/2022   ALBUMIN 4.5 06/28/2022   CALCIUM 9.5 06/28/2022   ANIONGAP 9 07/25/2017   EGFR 114 06/28/2022   Lab Results  Component Value Date   CHOL 186 06/28/2022   Lab Results  Component Value Date   HDL 74 06/28/2022   Lab Results  Component Value Date   LDLCALC 85 06/28/2022   Lab Results  Component Value Date   TRIG 159 (H) 06/28/2022   Lab Results  Component Value Date   CHOLHDL 2.5 06/28/2022   Lab Results  Component Value Date   HGBA1C 5.3 06/28/2022      Assessment & Plan:   Problem List Items Addressed This Visit    Hypothyroidism Lab Results  Component Value Date   TSH 2.270 06/28/2022   On Levothyroxine 25 mcg QD,  needs to be compliant Had weight gain and hair loss before starting Levothyroxine Check TSH and free T4  Obesity (BMI 30-39.9) BMI Readings from Last 3 Encounters:  02/22/23 30.22 kg/m  09/28/22 30.65 kg/m  08/02/22 30.82 kg/m   Had lengthy discussion about medical weight loss options Did not improve with Phentermine GLP-1 agonist therapy could be better options, but cost/insurance coverage is a limiting factor Diet modification and moderate exercise advised Can try  intermittent fasting with vegetarian diet  Obsessive-compulsive disorder Intrusive thoughts and  acts history since childhood Periods of repetitive activities Has episodes of paranoia at times Better with Prozac 20 mg daily now   Meds ordered this encounter  Medications   levothyroxine (SYNTHROID) 25 MCG tablet    Sig: Take 1 tablet (25 mcg total) by mouth daily with breakfast.    Dispense:  30 tablet    Refill:  3   FLUoxetine (PROZAC) 20 MG capsule    Sig: Take 1 capsule (20 mg total) by mouth daily.    Dispense:  30 capsule    Refill:  3    Follow-up:  Return in about 4 months (around 07/03/2023) for Annual physical.    Anabel Halon, MD

## 2023-02-22 NOTE — Patient Instructions (Signed)
Please continue to take medications as prescribed. ? ?Please continue to follow low carb diet and perform moderate exercise/walking at least 150 mins/week. ?

## 2023-02-22 NOTE — Assessment & Plan Note (Addendum)
Lab Results  Component Value Date   TSH 2.270 06/28/2022   On Levothyroxine 25 mcg QD,  needs to be compliant Had weight gain and hair loss before starting Levothyroxine Check TSH and free T4

## 2023-02-22 NOTE — Assessment & Plan Note (Addendum)
BMI Readings from Last 3 Encounters:  02/22/23 30.22 kg/m  09/28/22 30.65 kg/m  08/02/22 30.82 kg/m   Had lengthy discussion about medical weight loss options Did not improve with Phentermine GLP-1 agonist therapy could be better options, but cost/insurance coverage is a limiting factor Diet modification and moderate exercise advised Can try  intermittent fasting with vegetarian diet

## 2023-02-22 NOTE — Assessment & Plan Note (Signed)
Intrusive thoughts and acts history since childhood Periods of repetitive activities Has episodes of paranoia at times Better with Prozac 20 mg daily now 

## 2023-02-23 LAB — CMP14+EGFR
ALT: 27 IU/L (ref 0–32)
AST: 23 IU/L (ref 0–40)
Albumin: 4.2 g/dL (ref 3.9–4.9)
Alkaline Phosphatase: 79 IU/L (ref 44–121)
BUN: 6 mg/dL (ref 6–24)
Bilirubin Total: 0.3 mg/dL (ref 0.0–1.2)
Calcium: 9.2 mg/dL (ref 8.7–10.2)
Chloride: 102 mmol/L (ref 96–106)
Creatinine, Ser: 0.62 mg/dL (ref 0.57–1.00)
Glucose: 99 mg/dL (ref 70–99)
Potassium: 4 mmol/L (ref 3.5–5.2)
Sodium: 139 mmol/L (ref 134–144)
Total Protein: 6.2 g/dL (ref 6.0–8.5)
eGFR: 115 mL/min/{1.73_m2} (ref >59)

## 2023-02-23 LAB — TSH+FREE T4: TSH: 1.91 u[IU]/mL (ref 0.450–4.500)

## 2023-05-03 ENCOUNTER — Other Ambulatory Visit: Payer: Self-pay | Admitting: Internal Medicine

## 2023-05-03 ENCOUNTER — Encounter: Payer: Self-pay | Admitting: Internal Medicine

## 2023-05-03 DIAGNOSIS — N760 Acute vaginitis: Secondary | ICD-10-CM

## 2023-05-03 MED ORDER — FLUCONAZOLE 150 MG PO TABS: 150.0000 mg | ORAL_TABLET | Freq: Once | ORAL | 0 refills | Status: AC

## 2023-05-23 ENCOUNTER — Ambulatory Visit
Admission: EM | Admit: 2023-05-23 | Discharge: 2023-05-23 | Disposition: A | Discharge status: Home / Self Care | Payer: 59 | Attending: Nurse Practitioner | Admitting: Nurse Practitioner

## 2023-05-23 DIAGNOSIS — N9489 Other specified conditions associated with female genital organs and menstrual cycle: Secondary | ICD-10-CM | POA: Insufficient documentation

## 2023-05-23 DIAGNOSIS — N760 Acute vaginitis: Secondary | ICD-10-CM | POA: Insufficient documentation

## 2023-05-23 LAB — POCT URINALYSIS DIP (MANUAL ENTRY)
Bilirubin, UA: NEGATIVE
Blood, UA: NEGATIVE
Glucose, UA: NEGATIVE mg/dL
Ketones, POC UA: NEGATIVE mg/dL
Leukocytes, UA: NEGATIVE
Nitrite, UA: NEGATIVE
Protein Ur, POC: NEGATIVE mg/dL
Spec Grav, UA: 1.015 (ref 1.010–1.025)
Urobilinogen, UA: 0.2 U/dL
pH, UA: 7.5 (ref 5.0–8.0)

## 2023-05-23 MED ORDER — FLUCONAZOLE 150 MG PO TABS: ORAL_TABLET | ORAL | 0 refills | Status: DC

## 2023-05-23 NOTE — Discharge Instructions (Signed)
The urinalysis was negative today.  Cytology swab is pending.  You will be contacted if the pending test result is abnormal.  You will also have access to results via MyChart. Take medication as prescribed. Recommend refraining from sexual intercourse until symptoms improve. If you have recurrent vaginal symptoms, you may try over-the-counter boric acid vaginal suppositories.  For the first week, insert 1 suppository vaginally nightly.  Then beginning on week 2, insert 1 suppository weekly. As discussed, if your pending test results are negative, and you continue to experience symptoms, please follow-up with your primary care physician or with gynecology for further evaluation. Follow-up as needed.

## 2023-05-23 NOTE — ED Provider Notes (Signed)
[facility]    CSN: [id number] Arrival date & time: 05/23/23  1338      History   Chief Complaint Chief Complaint  Patient presents with   Vaginal Itching    HPI Nicole Pollard is a 42 y.o. female.   The history is provided by the patient.   Patient presents for complaints of vaginal "burning" for the past 3 days.  She denies vaginal discharge, vaginal odor, or vaginal itching.  She states that the burning increases with urination.  She states that she is also noticed some urinary frequency.  She denies fever, chills, abdominal pain, hematuria, decreased urine stream, flank pain, or low back pain.  Patient reports 1 female partner in the past 90 days.  LMP: 05/09/2023.  Past Medical History:  Diagnosis Date   Abnormal Pap smear of cervix    12/15/14 - ASCUS + HPV   Anxiety 01/28/2015   Overview:  On zoloft 50mg   EPDS 7 on 03/18/15 Mood stable  Last Assessment & Plan:  Pt states that her mood is stable on zoloft 50mg    Anxiety 01/28/2015   Overview:  On zoloft 50mg   EPDS 7 on 03/18/15 Mood stable  Last Assessment & Plan:  Pt states that her mood is stable on zoloft 50mg   Formatting of this note might be different from the original. On zoloft 50mg   EPDS 7 on 03/18/15 Mood stable  Overview:  Overview:  On zoloft 50mg   EPDS 7 on 03/18/15 Mood stable  Last Assessment & Plan:  Pt states that her mood is stable on zoloft 50mg   Last Assessmen   Cellulitis of abdominal wall 09/01/2016   Hypothyroid    Medical history non-contributory    Short cervix affecting pregnancy 05/23/2015   Cerclage placed 04/03/15 at [facility]    Vaginal Pap smear, abnormal    06/23/14 ASCUS + HPV    Patient Active Problem List   Diagnosis Date Noted   Tick bite of lower back 10/19/2022   Encounter for general adult medical examination with abnormal findings 06/28/2022   Obesity (BMI 30-39.9) 06/28/2022   Umbilical hernia without obstruction and without gangrene 02/10/2022   Acne vulgaris  11/11/2021   OSA (obstructive sleep apnea) 10/01/2021   Obsessive-compulsive disorder 07/21/2021   Mixed hyperlipidemia 05/31/2021   Hypothyroidism 03/31/2021   Cervical pain (neck) 03/29/2021   Vitamin D deficiency 03/29/2021   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 Pregnancy history some records show Gravida 7 others show Gravida 8          Home Medications    Prior to Admission medications   Medication Sig Start Date End  Date Taking? Authorizing Provider  fluconazole (DIFLUCAN) 150 MG tablet Take one tablet by mouth today. May repeat every 72 hours as needed. 05/23/23  Yes Leath-Warren, Sadie Haber, NP  cholecalciferol (VITAMIN D3) 25 MCG (1000 UNIT) tablet Take 1,000 Units by mouth daily.    [provider]  FLUoxetine (PROZAC) 20 MG capsule Take 1 capsule (20 mg total) by mouth daily. 02/22/23   Anabel Halon, MD  levothyroxine (SYNTHROID) 25 MCG tablet Take 1 tablet (25 mcg total) by mouth daily with breakfast. 02/22/23   Anabel Halon, MD  Magnesium 400 MG CAPS Take 400 mg by mouth daily. Taking for PVCs    [provider]  tretinoin (RETIN-A) 0.025 % cream Apply topically at bedtime. 11/12/21   Anabel Halon, MD  omeprazole (PRILOSEC) 20 MG capsule Take 1 capsule (20 mg total) by mouth daily. 07/25/17 02/21/20  Eber Hong, MD  ranitidine (ZANTAC) 150 MG tablet Take 1 tablet (150 mg total) by mouth 2 (two) times  daily. 07/25/17 01/26/20  Eber Hong, MD    Family History Family History  Problem Relation Age of Onset   Alcohol abuse Mother    Drug abuse Father    Hypertension Father    Anxiety disorder Maternal Aunt    Cancer Maternal Grandmother        Cervical    Social History Social History   Tobacco Use   Smoking status: Never   Smokeless tobacco: Never  Substance Use Topics   Alcohol use: Yes    Comment: Social   Drug use: No     Allergies   Patient has no known allergies.   Review of Systems Review of Systems Per HPI  Physical Exam Triage Vital Signs ED Triage Vitals [05/23/23 1345]  Encounter Vitals Group     BP 131/83     Systolic BP Percentile      Diastolic BP Percentile      Pulse Rate 78     Resp 19     Temp (!) 97.5 F (36.4 C)     Temp src      SpO2 98 %     Weight      Height      Head Circumference      Peak Flow      Pain Score 0     Pain Loc      Pain Education      Exclude from Growth Chart    No data found.  Updated Vital Signs BP 131/83   Pulse 78   Temp (!) 97.5 F (36.4 C)   Resp 19   LMP 05/09/2023   SpO2 98%   Visual Acuity Right Eye Distance:   Left Eye Distance:   Bilateral Distance:    Right Eye Near:   Left Eye Near:    Bilateral Near:     Physical Exam Vitals and nursing note reviewed.  Constitutional:      General: She is not in acute distress.    Appearance: Normal appearance.  HENT:     Head: Normocephalic.  Eyes:     Extraocular Movements: Extraocular movements intact.     Pupils: Pupils are equal, round, and reactive to light.  Genitourinary:    Comments: GU exam deferred, self swab performed   Neurological:     Mental Status: She is alert.  Psychiatric:        Mood and Affect: Mood normal.        Behavior: Behavior normal.  UC Treatments / Results  Labs (all labs ordered are listed, but only abnormal results are displayed) Labs Reviewed  POCT URINALYSIS DIP (MANUAL ENTRY)   CERVICOVAGINAL ANCILLARY ONLY    EKG   Radiology No results found.  Procedures Procedures (including critical care time)  Medications Ordered in UC Medications - No data to display  Initial Impression / Assessment and Plan / UC Course  I have reviewed the triage vital signs and the nursing notes.  Pertinent labs & imaging results that were available during my care of the patient were reviewed by me and considered in my medical decision making (see chart for details).  Urinalysis is negative.  Cytology swab is pending.  Symptoms consistent with vaginitis.  Will treat patient with fluconazole 150 mg tablets while cytology swab is pending.  Supportive care recommendations were provided and discussed with the patient to include refraining from sexual intercourse until symptoms improve, use of boric acid vaginal suppositories, and following up as needed.  Patient was advised if her pending test results are negative and she is continuing to experience symptoms, it is recommended that she follow-up with her PCP or with gynecology for further evaluation.  Patient is in agreement with this plan of care and verbalizes understanding.  All questions were answered.  Patient stable for discharge.  Final Clinical Impressions(s) / UC Diagnoses   Final diagnoses:  Vaginal burning     Discharge Instructions      The urinalysis was negative today.  Cytology swab is pending.  You will be contacted if the pending test result is abnormal.  You will also have access to results via MyChart. Take medication as prescribed. Recommend refraining from sexual intercourse until symptoms improve. If you have recurrent vaginal symptoms, you may try over-the-counter boric acid vaginal suppositories.  For the first week, insert 1 suppository vaginally nightly.  Then beginning on week 2, insert 1 suppository weekly. As discussed, if your pending test results are negative, and you continue to experience symptoms,  please follow-up with your primary care physician or with gynecology for further evaluation. Follow-up as needed.     ED Prescriptions     Medication Sig Dispense Auth. Provider   fluconazole (DIFLUCAN) 150 MG tablet Take one tablet by mouth today. May repeat every 72 hours as needed. 3 tablet Leath-Warren, Sadie Haber, NP      PDMP not reviewed this encounter.   Abran Cantor, NP 05/23/23 1454

## 2023-05-23 NOTE — ED Triage Notes (Signed)
Pt presents with complaints of vaginal burning x 3 days. Unsure if she has UTI or yeast infection. Denies vaginal discharge or sores.

## 2023-05-24 LAB — CERVICOVAGINAL ANCILLARY ONLY
Bacterial Vaginitis (gardnerella): NEGATIVE
Candida Glabrata: NEGATIVE
Candida Vaginitis: NEGATIVE
Chlamydia: NEGATIVE
Comment: NEGATIVE
Comment: NEGATIVE
Comment: NEGATIVE
Comment: NEGATIVE
Comment: NEGATIVE
Comment: NORMAL
Neisseria Gonorrhea: NEGATIVE
Trichomonas: NEGATIVE

## 2023-06-27 ENCOUNTER — Encounter: Payer: Self-pay | Admitting: Internal Medicine

## 2023-06-27 ENCOUNTER — Ambulatory Visit (INDEPENDENT_AMBULATORY_CARE_PROVIDER_SITE_OTHER): Payer: 59 | Admitting: Internal Medicine

## 2023-06-27 VITALS — BP 131/86 | HR 86 | Temp 98.2°F | Resp 16 | Ht 63.0 in | Wt 168.0 lb

## 2023-06-27 DIAGNOSIS — J309 Allergic rhinitis, unspecified: Secondary | ICD-10-CM | POA: Insufficient documentation

## 2023-06-27 DIAGNOSIS — F429 Obsessive-compulsive disorder, unspecified: Secondary | ICD-10-CM | POA: Diagnosis not present

## 2023-06-27 DIAGNOSIS — L659 Nonscarring hair loss, unspecified: Secondary | ICD-10-CM

## 2023-06-27 DIAGNOSIS — E039 Hypothyroidism, unspecified: Secondary | ICD-10-CM

## 2023-06-27 DIAGNOSIS — E782 Mixed hyperlipidemia: Secondary | ICD-10-CM

## 2023-06-27 DIAGNOSIS — E559 Vitamin D deficiency, unspecified: Secondary | ICD-10-CM

## 2023-06-27 DIAGNOSIS — Z0001 Encounter for general adult medical examination with abnormal findings: Secondary | ICD-10-CM

## 2023-06-27 DIAGNOSIS — J0110 Acute frontal sinusitis, unspecified: Secondary | ICD-10-CM | POA: Insufficient documentation

## 2023-06-27 DIAGNOSIS — M25552 Pain in left hip: Secondary | ICD-10-CM

## 2023-06-27 MED ORDER — FLUTICASONE PROPIONATE 50 MCG/ACT NA SUSP: 2.0000 | Freq: Every day | NASAL | 1 refills | Status: DC

## 2023-06-27 NOTE — Assessment & Plan Note (Signed)
Could be multifactorial, different shampoo vs stress vs thyroid (?) vs nutritional deficiency No signs of fungal infection for now Advised to take Biotin and Zinc Check TSH and CMP Referred to Dermatology

## 2023-06-27 NOTE — Assessment & Plan Note (Signed)
Last vitamin D Lab Results  Component Value Date   VD25OH 30.1 06/28/2022   Advised to take Vitamin D 2000 IU QD

## 2023-06-27 NOTE — Assessment & Plan Note (Signed)
Check flu, COVID and RSV test And advised to continue symptomatic treatment for now  Mucinex or Robitussin as needed for cough Tylenol as needed for fever or myalgias If persistent symptoms despite negative viral testing, will start antibiotic

## 2023-06-27 NOTE — Assessment & Plan Note (Addendum)
Likely due to heavy waist belt Work note provided to get alternative protective equipment such as a vest If persistent, will get imaging

## 2023-06-27 NOTE — Assessment & Plan Note (Addendum)
Lab Results  Component Value Date   TSH 1.910 02/22/2023   On Levothyroxine 25 mcg QD,  needs to be compliant Had weight gain and hair loss before starting Levothyroxine, hair loss has recurred now Check TSH and free T4

## 2023-06-27 NOTE — Assessment & Plan Note (Signed)
Intrusive thoughts and acts history since childhood Periods of repetitive activities Has episodes of paranoia at times Better with Prozac 20 mg daily now

## 2023-06-27 NOTE — Progress Notes (Signed)
Established Patient Office Visit  Subjective:  Patient ID: Nicole Pollard, female    DOB: 10-30-1980  Age: 42 y.o. MRN: [id number]  CC:  Chief Complaint  Patient presents with   Sore Throat    Irritated sore throat, worse at night, chest congestion and cough x 3 days. Today she started being able to produce mucus and its slightly yellowish. No fever.      HPI Nicole Pollard is a 42 y.o. female with past medical history of hypothyroidism, OCD, PTSD and diastasis recti s/p surgery who presents for annual physical.  Obesity: She has been trying to follow strict low-carb diet and has been exercising regularly. She was having difficulty losing weight, but had started losing weight following vegetarian diet for the last few months. She still wants to get medical help. I have had lengthy discussion about medical weight loss options, including stimulants, other oral options and GLP-1 agonist therapy in the last visit.  Had stopped taking phentermine as it was not helping with weight loss.  OCD and PTSD: She has been feeling better with Prozac now. Denies episodes of anxiety and anhedonia at times.  She currently denies any SI or HI.  She is able to focus better with Prozac now.   Hypothyroidism: She takes levothyroxine 25 mcg QD.  Reports that she had stopped taking it in between, but has started taking it regularly now.  Denies any recent change in appetite, worsening of fatigue or leg swelling. She has started having hair loss again.  She reports sore throat, cough, nasal congestion and sinus pressure related headache for the last 2 days.  She has had yellowish expectoration.  Denies any dyspnea or wheezing currently.  She has tried taking ibuprofen for headache with mild relief.  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 She has had difficulty walking or running on duty due to heavy belt and hip pain.  Denies any numbness or tingling of  the LE.  Past Medical History:  Diagnosis Date   Abnormal Pap smear of cervix    12/15/14 - ASCUS + HPV   Anxiety 01/28/2015   Overview:  On zoloft 50mg   EPDS 7 on 03/18/15 Mood stable  Last Assessment & Plan:  Pt states that her mood is stable on zoloft 50mg    Anxiety 01/28/2015   Overview:  On zoloft 50mg   EPDS 7 on 03/18/15 Mood stable  Last Assessment & Plan:  Pt states that her mood is stable on zoloft 50mg   Formatting of this note might be different from the original. On zoloft 50mg   EPDS 7 on 03/18/15 Mood stable  Overview:  Overview:  On zoloft 50mg   EPDS 7 on 03/18/15 Mood stable  Last Assessment & Plan:  Pt states that her mood is stable on zoloft 50mg   Last Assessmen   Cellulitis of abdominal wall 09/01/2016   Hypothyroid    Medical history non-contributory    Short cervix affecting pregnancy 05/23/2015   Cerclage [facility]/03/15 at [facility]    Vaginal Pap smear, abnormal    06/23/14 ASCUS + HPV    Past Surgical History:  Procedure Laterality Date   ABDOMINOPLASTY  06/28/2016   [place], [place]   APPENDECTOMY     CESAREAN SECTION     TUBAL LIGATION      Family History  Problem Relation Age of Onset   Alcohol abuse Mother    Drug abuse Father    Hypertension Father  Anxiety disorder Maternal Aunt    Cancer Maternal Grandmother        Cervical    Social History   Socioeconomic History   Marital status: Divorced    Spouse name: Not on file   Number of children: Not on file   Years of education: Not on file   Highest education level: Not on file  Occupational History   Not on file  Tobacco Use   Smoking status: Never   Smokeless tobacco: Never  Substance and Sexual Activity   Alcohol use: Yes    Comment: Social   Drug use: No   Sexual activity: Yes    Birth control/protection: Surgical  Other Topics Concern   Not on file  Social History Narrative   Not on file   Social Determinants of Health   Financial Resource Strain: Low Risk  (04/01/2021)   Overall  Financial Resource Strain (CARDIA)    Difficulty of Paying Living Expenses: Not hard at all  Food Insecurity: No Food Insecurity (04/01/2021)   Hunger Vital Sign    Worried About Running Out of Food in the Last Year: Never true    Ran Out of Food in the Last Year: Never true  Transportation Needs: No Transportation Needs (04/01/2021)   PRAPARE - Administrator, Civil Service (Medical): No    Lack of Transportation (Non-Medical): No  Physical Activity: Insufficiently Active (04/01/2021)   Exercise Vital Sign    Days of Exercise per Week: 1 day    Minutes of Exercise per Session: 10 min  Stress: Stress Concern Present (04/01/2021)   [employer] of Occupational Health - Occupational Stress Questionnaire    Feeling of Stress : Rather much  Social Connections: Moderately Integrated (04/01/2021)   Social Connection and Isolation Panel [NHANES]    Frequency of Communication with Friends and Family: Twice a week    Frequency of Social Gatherings with Friends and Family: Once a week    Attends Religious Services: More than 4 times per year    Active Member of [employer] or Organizations: No    Attends Banker Meetings: Never    Marital Status: Living with partner  Intimate Partner Violence: Not At Risk (04/01/2021)   Humiliation, Afraid, Rape, and Kick questionnaire    Fear of Current or Ex-Partner: No    Emotionally Abused: No    Physically Abused: No    Sexually Abused: No    Outpatient Medications Prior to Visit  Medication Sig Dispense Refill   cholecalciferol (VITAMIN D3) 25 MCG (1000 UNIT) tablet Take 1,000 Units by mouth daily.     FLUoxetine (PROZAC) 20 MG capsule Take 1 capsule (20 mg total) by mouth daily. 30 capsule 3   levothyroxine (SYNTHROID) 25 MCG tablet Take 1 tablet (25 mcg total) by mouth daily with breakfast. 30 tablet 3   Magnesium 400 MG CAPS Take 400 mg by mouth daily. Taking for PVCs     tretinoin (RETIN-A) 0.025 % cream Apply topically at bedtime.  45 g 0   fluconazole (DIFLUCAN) 150 MG tablet Take one tablet by mouth today. May repeat every 72 hours as needed. 3 tablet 0   No facility-administered medications prior to visit.    No Known Allergies  ROS Review of Systems  Constitutional:  Negative for chills and fever.  HENT:  Positive for congestion, postnasal drip and sinus pressure.   Eyes:  Negative for pain and discharge.  Respiratory:  Positive for cough. Negative for shortness of  breath.   Cardiovascular:  Negative for chest pain and palpitations.  Gastrointestinal:  Negative for nausea and vomiting.  Genitourinary:  Negative for dysuria and hematuria.  Musculoskeletal:  Negative for neck pain and neck stiffness.  Skin:  Negative for rash.  Neurological:  Negative for dizziness and weakness.  Psychiatric/Behavioral:  Negative for agitation and behavioral problems.       Objective:    Physical Exam Vitals reviewed.  Constitutional:      General: She is not in acute distress.    Appearance: She is not diaphoretic.  HENT:     Head: Normocephalic and atraumatic.     Nose: Congestion present.     Mouth/Throat:     Mouth: Mucous membranes are moist.     Pharynx: Posterior oropharyngeal erythema present.  Eyes:     General: No scleral icterus.    Extraocular Movements: Extraocular movements intact.  Cardiovascular:     Rate and Rhythm: Normal rate and regular rhythm.     Pulses: Normal pulses.     Heart sounds: Normal heart sounds. No murmur heard. Pulmonary:     Breath sounds: Normal breath sounds. No wheezing or rales.  Abdominal:     Palpations: Abdomen is soft.     Tenderness: There is no abdominal tenderness.  Musculoskeletal:     Cervical back: Neck supple. No tenderness.     Right lower leg: No edema.     Left lower leg: No edema.  Skin:    General: Skin is warm.     Findings: No rash.  Neurological:     General: No focal deficit present.     Mental Status: She is alert and oriented to person,  place, and time.     Cranial Nerves: No cranial nerve deficit.     Sensory: No sensory deficit.     Motor: No weakness.  Psychiatric:        Mood and Affect: Mood normal. Mood is not anxious.        Behavior: Behavior normal.     BP 131/86   Pulse 86   Temp 98.2 F (36.8 C) (Oral)   Resp 16   Ht 5\' 3"  (1.6 m)   Wt 168 lb (76.2 kg)   SpO2 97%   BMI 29.76 kg/m  Wt Readings from Last 3 Encounters:  06/27/23 168 lb (76.2 kg)  02/22/23 170 lb 9.6 oz (77.4 kg)  09/28/22 173 lb (78.5 kg)    Lab Results  Component Value Date   TSH 1.910 02/22/2023   Lab Results  Component Value Date   WBC 9.0 06/28/2022   HGB 12.6 06/28/2022   HCT 38.0 06/28/2022   MCV 89 06/28/2022   PLT 358 06/28/2022   Lab Results  Component Value Date   NA 139 02/22/2023   K 4.0 02/22/2023   CO2 22 02/22/2023   GLUCOSE 99 02/22/2023   BUN 6 02/22/2023   CREATININE 0.62 02/22/2023   BILITOT 0.3 02/22/2023   ALKPHOS 79 02/22/2023   AST 23 02/22/2023   ALT 27 02/22/2023   PROT 6.2 02/22/2023   ALBUMIN 4.2 02/22/2023   CALCIUM 9.2 02/22/2023   ANIONGAP 9 07/25/2017   EGFR 115 02/22/2023   Lab Results  Component Value Date   CHOL 186 06/28/2022   Lab Results  Component Value Date   HDL 74 06/28/2022   Lab Results  Component Value Date   LDLCALC 85 06/28/2022   Lab Results  Component Value Date   TRIG  159 (H) 06/28/2022   Lab Results  Component Value Date   CHOLHDL 2.5 06/28/2022   Lab Results  Component Value Date   HGBA1C 5.3 06/28/2022      Assessment & Plan:   Problem List Items Addressed This Visit       Respiratory   Acute frontal sinusitis    Check flu, COVID and RSV test And advised to continue symptomatic treatment for now  Mucinex or Robitussin as needed for cough Tylenol as needed for fever or myalgias If persistent symptoms despite negative viral testing, will start antibiotic      Relevant Medications   fluticasone (FLONASE) 50 MCG/ACT nasal spray    Other Relevant Orders   COVID-19, Flu A+B and RSV     Endocrine   Hypothyroidism    Lab Results  Component Value Date   TSH 1.910 02/22/2023   On Levothyroxine 25 mcg QD,  needs to be compliant Had weight gain and hair loss before starting Levothyroxine, hair loss has recurred now Check TSH and free T4      Relevant Orders   CMP14+EGFR   CBC with Differential/Platelet   TSH + free T4     Other   Obsessive-compulsive disorder (Chronic)    Intrusive thoughts and acts history since childhood Periods of repetitive activities Has episodes of paranoia at times Better with Prozac 20 mg daily now      Relevant Orders   CMP14+EGFR   CBC with Differential/Platelet   TSH + free T4   Hair loss    Could be multifactorial, different shampoo vs stress vs thyroid (?) vs nutritional deficiency No signs of fungal infection for now Advised to take Biotin and Zinc Check TSH and CMP Referred to Dermatology      Relevant Orders   Ambulatory referral to Dermatology   Vitamin D deficiency    Last vitamin D Lab Results  Component Value Date   VD25OH 30.1 06/28/2022   Advised to take Vitamin D 2000 IU QD      Relevant Orders   VITAMIN D 25 Hydroxy (Vit-D Deficiency, Fractures)   Mixed hyperlipidemia    Check lipid profile Advised to continue plant-based, low cholesterol diet      Relevant Orders   Lipid panel   Encounter for general adult medical examination with abnormal findings - Primary    Physical exam as documented. Counseling done  re healthy lifestyle involving commitment to 150 minutes exercise per week, heart healthy diet, and attaining healthy weight.The importance of adequate sleep also discussed. Immunization and cancer screening needs are specifically addressed at this visit.      Left hip pain    Likely due to heavy waist belt Work note provided to get alternative protective equipment such as a vest If persistent, will get imaging       Meds ordered this  encounter  Medications   fluticasone (FLONASE) 50 MCG/ACT nasal spray    Sig: Place 2 sprays into both nostrils daily.    Dispense:  16 g    Refill:  1    Follow-up: Return in about 4 months (around 10/25/2023) for OCD and Hypothyroidism.    Anabel Halon, MD

## 2023-06-27 NOTE — Patient Instructions (Addendum)
Please use Flonase for nasal congestion.  Okay to take Robitussin as needed for cough.  Please continue to take other medications as prescribed.  Please continue to follow low carb diet and perform moderate exercise/walking at least 150 mins/week.

## 2023-06-27 NOTE — Assessment & Plan Note (Signed)
Check lipid profile Advised to continue plant-based, low cholesterol diet

## 2023-06-27 NOTE — Assessment & Plan Note (Signed)
Physical exam as documented. Counseling done  re healthy lifestyle involving commitment to 150 minutes exercise per week, heart healthy diet, and attaining healthy weight.The importance of adequate sleep also discussed. Immunization and cancer screening needs are specifically addressed at this visit.

## 2023-06-28 LAB — CBC WITH DIFFERENTIAL/PLATELET
Basophils Absolute: 0.1 10*3/uL (ref 0.0–0.2)
Basos: 1 %
EOS (ABSOLUTE): 0.2 10*3/uL (ref 0.0–0.4)
Eos: 2 %
Hematocrit: 36.5 % (ref 34.0–46.6)
Hemoglobin: 12 g/dL (ref 11.1–15.9)
Immature Grans (Abs): 0 10*3/uL (ref 0.0–0.1)
Immature Granulocytes: 0 %
Lymphocytes Absolute: 2.3 10*3/uL (ref 0.7–3.1)
Lymphs: 30 %
MCH: 28.9 pg (ref 26.6–33.0)
MCHC: 32.9 g/dL (ref 31.5–35.7)
MCV: 88 fL (ref 79–97)
Monocytes Absolute: 0.8 10*3/uL (ref 0.1–0.9)
Monocytes: 11 %
Neutrophils Absolute: 4.1 10*3/uL (ref 1.4–7.0)
Neutrophils: 56 %
Platelets: 463 10*3/uL — ABNORMAL HIGH (ref 150–450)
RBC: 4.15 x10E6/uL (ref 3.77–5.28)
RDW: 13.1 % (ref 11.7–15.4)
WBC: 7.5 10*3/uL (ref 3.4–10.8)

## 2023-06-28 LAB — LIPID PANEL
Chol/HDL Ratio: 2.8 {ratio} (ref 0.0–4.4)
Cholesterol, Total: 202 mg/dL — ABNORMAL HIGH (ref 100–199)
HDL: 71 mg/dL (ref >39)
LDL Chol Calc (NIH): 116 mg/dL — ABNORMAL HIGH (ref 0–99)
Triglycerides: 85 mg/dL (ref 0–149)
VLDL Cholesterol Cal: 15 mg/dL (ref 5–40)

## 2023-06-28 LAB — CMP14+EGFR
ALT: 16 [IU]/L (ref 0–32)
AST: 16 [IU]/L (ref 0–40)
Albumin: 4.1 g/dL (ref 3.9–4.9)
Alkaline Phosphatase: 93 [IU]/L (ref 44–121)
BUN/Creatinine Ratio: 11 (ref 9–23)
BUN: 7 mg/dL (ref 6–24)
Bilirubin Total: 0.3 mg/dL (ref 0.0–1.2)
CO2: 25 mmol/L (ref 20–29)
Calcium: 9.3 mg/dL (ref 8.7–10.2)
Chloride: 101 mmol/L (ref 96–106)
Creatinine, Ser: 0.64 mg/dL (ref 0.57–1.00)
Globulin, Total: 2.3 g/dL (ref 1.5–4.5)
Glucose: 79 mg/dL (ref 70–99)
Potassium: 4.5 mmol/L (ref 3.5–5.2)
Sodium: 139 mmol/L (ref 134–144)
Total Protein: 6.4 g/dL (ref 6.0–8.5)
eGFR: 114 mL/min/{1.73_m2} (ref >59)

## 2023-06-28 LAB — TSH+FREE T4
Free T4: 1.17 ng/dL (ref 0.82–1.77)
TSH: 1.97 u[IU]/mL (ref 0.450–4.500)

## 2023-06-28 LAB — VITAMIN D 25 HYDROXY (VIT D DEFICIENCY, FRACTURES): Vit D, 25-Hydroxy: 86.4 ng/mL (ref 30.0–100.0)

## 2023-06-29 LAB — COVID-19, FLU A+B AND RSV
Influenza A, NAA: NOT DETECTED
Influenza B, NAA: NOT DETECTED
RSV, NAA: NOT DETECTED
SARS-CoV-2, NAA: NOT DETECTED

## 2023-08-31 ENCOUNTER — Other Ambulatory Visit: Payer: Self-pay | Admitting: Internal Medicine

## 2023-08-31 DIAGNOSIS — E039 Hypothyroidism, unspecified: Secondary | ICD-10-CM

## 2023-08-31 DIAGNOSIS — F429 Obsessive-compulsive disorder, unspecified: Secondary | ICD-10-CM

## 2023-10-25 ENCOUNTER — Encounter: Payer: Self-pay | Admitting: Internal Medicine

## 2023-10-25 ENCOUNTER — Ambulatory Visit: Payer: 59 | Admitting: Internal Medicine

## 2023-10-25 VITALS — BP 117/78 | HR 98 | Ht 63.0 in | Wt 151.6 lb

## 2023-10-25 DIAGNOSIS — E669 Obesity, unspecified: Secondary | ICD-10-CM | POA: Diagnosis not present

## 2023-10-25 DIAGNOSIS — L700 Acne vulgaris: Secondary | ICD-10-CM

## 2023-10-25 DIAGNOSIS — E039 Hypothyroidism, unspecified: Secondary | ICD-10-CM

## 2023-10-25 DIAGNOSIS — F429 Obsessive-compulsive disorder, unspecified: Secondary | ICD-10-CM

## 2023-10-25 DIAGNOSIS — J309 Allergic rhinitis, unspecified: Secondary | ICD-10-CM

## 2023-10-25 MED ORDER — TRETINOIN 0.025 % EX CREA: TOPICAL_CREAM | Freq: Every day | CUTANEOUS | 0 refills | Status: AC

## 2023-10-25 MED ORDER — FLUTICASONE PROPIONATE 50 MCG/ACT NA SUSP: 2.0000 | Freq: Every day | NASAL | 1 refills | Status: AC

## 2023-10-25 NOTE — Assessment & Plan Note (Addendum)
 Lab Results  Component Value Date   TSH 1.970 06/27/2023   On Levothyroxine 25 mcg QD,  needs to be restart taking it and remain compliant Had weight gain and hair loss before starting Levothyroxine, hair loss has recurred now Check TSH and free T4

## 2023-10-25 NOTE — Assessment & Plan Note (Addendum)
 BMI Readings from Last 3 Encounters:  10/25/23 26.85 kg/m  06/27/23 29.76 kg/m  02/22/23 30.22 kg/m   Had lengthy discussion about medical weight loss options Did not improve with Phentermine GLP-1 agonist therapy could be better options, but cost/insurance coverage is a limiting factor - she did 3 months formulated semaglutide treatment in 2024 through a medical spa, BMI less than 27 now Diet modification and moderate exercise advised Can try intermittent fasting with vegetarian diet

## 2023-10-25 NOTE — Assessment & Plan Note (Addendum)
 Her nasal congestion and sinus pressure related headache likely due to allergic sinusitis Prescribed Flonase Zyrtec as needed for allergies

## 2023-10-25 NOTE — Patient Instructions (Signed)
 Please start taking Levothyroxine 25 mcg as prescribed.  Please eat at regular intervals and maintain at least 64 ounces of fluid in a day.  Please get blood tests done before the next visit.

## 2023-10-25 NOTE — Progress Notes (Signed)
 Established Patient Office Visit  Subjective:  Patient ID: Nicole Pollard, female    DOB: 07/07/1981  Age: 43 y.o. MRN: [id number]  CC:  Chief Complaint  Patient presents with   Care Management    4 month f/u    HPI Nicole Pollard is a 43 y.o. female with past medical history of hypothyroidism, OCD, PTSD and diastasis recti s/p surgery who presents for f/u of her chronic medical conditions.  Obesity: She had, compounded semaglutide through a medical spa for 3 months and has lost 16 lbs since the last visit. She has been trying to follow strict low-carb diet and has been exercising regularly. She was having difficulty losing weight, but had started losing weight following vegetarian diet in the past. Has tried phentermine, but it was not helping with weight loss.  OCD and PTSD: She had been feeling better with Prozac now,  but has stopped now as she felt it was making her emotionless. She has adapted Muslim religion recently, and is following Ramadan fasts currently. 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 She had worsening of anxiety initially after stopping Prozac, but is feeling better now. Denies episodes of anxiety and anhedonia now.  She currently denies any SI or HI.  She was able to focus better with Prozac before, but does not want to take medicine for now.  Hypothyroidism: She has stopped taking levothyroxine 25 mcg QD.  Denies any recent change in appetite, or leg swelling. She has started having hair loss and fatigue again.  She reports sore throat, cough, nasal congestion and sinus pressure related headache for the last 2 days. Denies any dyspnea or wheezing currently.  She has tried taking ibuprofen for headache with mild relief.  Past Medical History:  Diagnosis Date   Abnormal Pap smear of cervix    12/15/14 - ASCUS + HPV   Anxiety 01/28/2015   Overview:  On zoloft 50mg   EPDS 7 on 03/18/15 Mood stable  Last Assessment & Plan:  Pt states  that her mood is stable on zoloft 50mg    Anxiety 01/28/2015   Overview:  On zoloft 50mg   EPDS 7 on 03/18/15 Mood stable  Last Assessment & Plan:  Pt states that her mood is stable on zoloft 50mg   Formatting of this note might be different from the original. On zoloft 50mg   EPDS 7 on 03/18/15 Mood stable  Overview:  Overview:  On zoloft 50mg   EPDS 7 on 03/18/15 Mood stable  Last Assessment & Plan:  Pt states that her mood is stable on zoloft 50mg   Last Assessmen   Cellulitis of abdominal wall 09/01/2016   Hypothyroid    Medical history non-contributory    Short cervix affecting pregnancy 05/23/2015   Cerclage [facility]03/15 [facility]    Vaginal Pap smear, abnormal    06/23/14 ASCUS + HPV    Past Surgical History:  Procedure Laterality Date   ABDOMINOPLASTY  06/28/2016   [place], [place]   APPENDECTOMY     CESAREAN SECTION     TUBAL LIGATION      Family History  Problem Relation Age of Onset   Alcohol abuse Mother    Drug abuse Father    Hypertension Father    Anxiety disorder Maternal Aunt    Cancer Maternal Grandmother        Cervical    Social History   Socioeconomic History   Marital status: Divorced    Spouse name:  Not on file   Number of children: Not on file   Years of education: Not on file   Highest education level: Not on file  Occupational History   Not on file  Tobacco Use   Smoking status: Never   Smokeless tobacco: Never  Substance and Sexual Activity   Alcohol use: Yes    Comment: Social   Drug use: No   Sexual activity: Yes    Birth control/protection: Surgical  Other Topics Concern   Not on file  Social History Narrative   Not on file   Social Drivers of Health   Financial Resource Strain: Low Risk  (04/01/2021)   Overall Financial Resource Strain (CARDIA)    Difficulty of Paying Living Expenses: Not hard at all  Food Insecurity: No Food Insecurity (04/01/2021)   Hunger Vital Sign    Worried About Running Out of Food in the Last Year: Never true     Ran Out of Food in the Last Year: Never true  Transportation Needs: No Transportation Needs (04/01/2021)   PRAPARE - Administrator, Civil Service (Medical): No    Lack of Transportation (Non-Medical): No  Physical Activity: Insufficiently Active (04/01/2021)   Exercise Vital Sign    Days of Exercise per Week: 1 day    Minutes of Exercise per Session: 10 min  Stress: Stress Concern Present (04/01/2021)   [employer] of Occupational Health - Occupational Stress Questionnaire    Feeling of Stress : Rather much  Social Connections: Moderately Integrated (04/01/2021)   Social Connection and Isolation Panel [NHANES]    Frequency of Communication with Friends and Family: Twice a week    Frequency of Social Gatherings with Friends and Family: Once a week    Attends Religious Services: More than 4 times per year    Active Member of [employer] or Organizations: No    Attends Banker Meetings: Never    Marital Status: Living with partner  Intimate Partner Violence: Not At Risk (04/01/2021)   Humiliation, Afraid, Rape, and Kick questionnaire    Fear of Current or Ex-Partner: No    Emotionally Abused: No    Physically Abused: No    Sexually Abused: No    Outpatient Medications Prior to Visit  Medication Sig Dispense Refill   tretinoin (RETIN-A) 0.025 % cream Apply topically at bedtime. 45 g 0   cholecalciferol (VITAMIN D3) 25 MCG (1000 UNIT) tablet Take 1,000 Units by mouth daily. (Patient not taking: Reported on 10/25/2023)     levothyroxine (SYNTHROID) 25 MCG tablet TAKE 1 TABLET BY MOUTH ONCE DAILY WITH BREAKFAST. (Patient not taking: Reported on 10/25/2023) 30 tablet 3   Magnesium 400 MG CAPS Take 400 mg by mouth daily. Taking for PVCs (Patient not taking: Reported on 10/25/2023)     FLUoxetine (PROZAC) 20 MG capsule TAKE 1 CAPSULE BY MOUTH DAILY. (Patient not taking: Reported on 10/25/2023) 30 capsule 3   fluticasone (FLONASE) 50 MCG/ACT nasal spray Place 2 sprays into both  nostrils daily. (Patient not taking: Reported on 10/25/2023) 16 g 1   No facility-administered medications prior to visit.    No Known Allergies  ROS Review of Systems  Constitutional:  Negative for chills and fever.  HENT:  Positive for congestion, postnasal drip and sinus pressure.   Eyes:  Negative for pain and discharge.  Respiratory:  Negative for cough and shortness of breath.   Cardiovascular:  Negative for chest pain and palpitations.  Gastrointestinal:  Negative for nausea and  vomiting.  Genitourinary:  Negative for dysuria and hematuria.  Musculoskeletal:  Negative for neck pain and neck stiffness.  Skin:  Negative for rash.  Neurological:  Negative for dizziness and weakness.  Psychiatric/Behavioral:  Negative for agitation and behavioral problems.       Objective:    Physical Exam Vitals reviewed.  Constitutional:      General: She is not in acute distress.    Appearance: She is not diaphoretic.  HENT:     Head: Normocephalic and atraumatic.     Nose: Congestion present.     Mouth/Throat:     Mouth: Mucous membranes are moist.     Pharynx: No posterior oropharyngeal erythema.  Eyes:     General: No scleral icterus.    Extraocular Movements: Extraocular movements intact.  Cardiovascular:     Rate and Rhythm: Normal rate and regular rhythm.     Heart sounds: Normal heart sounds. No murmur heard. Pulmonary:     Breath sounds: Normal breath sounds. No wheezing or rales.  Musculoskeletal:     Cervical back: Neck supple. No tenderness.     Right lower leg: No edema.     Left lower leg: No edema.  Skin:    General: Skin is warm.     Findings: Lesion (Aceniform over face area) present. No rash.  Neurological:     General: No focal deficit present.     Mental Status: She is alert and oriented to person, place, and time.     Sensory: No sensory deficit.     Motor: No weakness.  Psychiatric:        Mood and Affect: Mood normal. Mood is not anxious.         Behavior: Behavior normal.     BP 117/78   Pulse 98   Ht 5\' 3"  (1.6 m)   Wt 151 lb 9.6 oz (68.8 kg)   SpO2 98%   BMI 26.85 kg/m  Wt Readings from Last 3 Encounters:  10/25/23 151 lb 9.6 oz (68.8 kg)  06/27/23 168 lb (76.2 kg)  02/22/23 170 lb 9.6 oz (77.4 kg)    Lab Results  Component Value Date   TSH 1.970 06/27/2023   Lab Results  Component Value Date   WBC 7.5 06/27/2023   HGB 12.0 06/27/2023   HCT 36.5 06/27/2023   MCV 88 06/27/2023   PLT 463 (H) 06/27/2023   Lab Results  Component Value Date   NA 139 06/27/2023   K 4.5 06/27/2023   CO2 25 06/27/2023   GLUCOSE 79 06/27/2023   BUN 7 06/27/2023   CREATININE 0.64 06/27/2023   BILITOT 0.3 06/27/2023   ALKPHOS 93 06/27/2023   AST 16 06/27/2023   ALT 16 06/27/2023   PROT 6.4 06/27/2023   ALBUMIN 4.1 06/27/2023   CALCIUM 9.3 06/27/2023   ANIONGAP 9 07/25/2017   EGFR 114 06/27/2023   Lab Results  Component Value Date   CHOL 202 (H) 06/27/2023   Lab Results  Component Value Date   HDL 71 06/27/2023   Lab Results  Component Value Date   LDLCALC 116 (H) 06/27/2023   Lab Results  Component Value Date   TRIG 85 06/27/2023   Lab Results  Component Value Date   CHOLHDL 2.8 06/27/2023   Lab Results  Component Value Date   HGBA1C 5.3 06/28/2022      Assessment & Plan:   Problem List Items Addressed This Visit       Respiratory   Allergic  sinusitis   Her nasal congestion and sinus pressure related headache likely due to allergic sinusitis Prescribed Flonase Zyrtec as needed for allergies      Relevant Medications   fluticasone (FLONASE) 50 MCG/ACT nasal spray     Endocrine   Hypothyroidism   Lab Results  Component Value Date   TSH 1.970 06/27/2023   On Levothyroxine 25 mcg QD,  needs to be restart taking it and remain compliant Had weight gain and hair loss before starting Levothyroxine, hair loss has recurred now Check TSH and free T4      Relevant Orders   TSH + free T4    CMP14+EGFR     Musculoskeletal and Integument   Acne vulgaris   Has facial wrinkles and acne Refilled tretinoin cream She has had tubal ligation in the past, has completed childbearing      Relevant Medications   tretinoin (RETIN-A) 0.025 % cream     Other   Obsessive-compulsive disorder - Primary (Chronic)   Intrusive thoughts and acts history since childhood Periods of repetitive activities Has episodes of paranoia at times Was better with Prozac 20 mg daily now, but denies to take it now  Her recent major decisions of life - adapting a new religion and leaving job are concerning, but she appears to be in stable mindset today, advised to contact if any change in condition or safety concern      Obesity (BMI 30-39.9)   BMI Readings from Last 3 Encounters:  10/25/23 26.85 kg/m  06/27/23 29.76 kg/m  02/22/23 30.22 kg/m   Had lengthy discussion about medical weight loss options Did not improve with Phentermine GLP-1 agonist therapy could be better options, but cost/insurance coverage is a limiting factor - she did 3 months formulated semaglutide treatment in 2024 through a medical spa, BMI less than 27 now Diet modification and moderate exercise advised Can try intermittent fasting with vegetarian diet       Meds ordered this encounter  Medications   tretinoin (RETIN-A) 0.025 % cream    Sig: Apply topically at bedtime.    Dispense:  45 g    Refill:  0   fluticasone (FLONASE) 50 MCG/ACT nasal spray    Sig: Place 2 sprays into both nostrils daily.    Dispense:  16 g    Refill:  1    Follow-up: Return in about 4 months (around 02/24/2024) for Hypothyroidism and PTSD.    Anabel Halon, MD

## 2023-10-25 NOTE — Assessment & Plan Note (Signed)
 Has facial wrinkles and acne Refilled tretinoin cream She has had tubal ligation in the past, has completed childbearing

## 2023-10-25 NOTE — Assessment & Plan Note (Addendum)
 Intrusive thoughts and acts history since childhood Periods of repetitive activities Has episodes of paranoia at times Was better with Prozac 20 mg daily now, but denies to take it now  Her recent major decisions of life - adapting a new religion and leaving job are concerning, but she appears to be in stable mindset today, advised to contact if any change in condition or safety concern

## 2023-12-20 ENCOUNTER — Telehealth: Admitting: Nurse Practitioner

## 2023-12-20 DIAGNOSIS — B3731 Acute candidiasis of vulva and vagina: Secondary | ICD-10-CM | POA: Diagnosis not present

## 2023-12-20 MED ORDER — FLUCONAZOLE 150 MG PO TABS: 150.0000 mg | ORAL_TABLET | Freq: Once | ORAL | 0 refills | Status: AC

## 2023-12-20 NOTE — Progress Notes (Signed)
 Virtual Visit Consent   Nicole Pollard, you are scheduled for a virtual visit with a [facility] provider today. Just as with appointments in the office, your consent must be obtained to participate. Your consent will be active for this visit and any virtual visit you may have with one of our providers in the next 365 days. If you have a MyChart account, a copy of this consent can be sent to you electronically.  As this is a virtual visit, video technology does not allow for your provider to perform a traditional examination. This may limit your provider's ability to fully assess your condition. If your provider identifies any concerns that need to be evaluated in person or the need to arrange testing (such as labs, EKG, etc.), we will make arrangements to do so. Although advances in technology are sophisticated, we cannot ensure that it will always work on either your end or our end. If the connection with a video visit is poor, the visit may have to be switched to a telephone visit. With either a video or telephone visit, we are not always able to ensure that we have a secure connection.  By engaging in this virtual visit, you consent to the provision of healthcare and authorize for your insurance to be billed (if applicable) for the services provided during this visit. Depending on your insurance coverage, you may receive a charge related to this service.  I need to obtain your verbal consent now. Are you willing to proceed with your visit today? [name] has provided verbal consent on 12/20/2023 for a virtual visit (video or telephone). [name], FNP  Date: 12/20/2023 4:09 PM   Virtual Visit via Video Note   I, [name], connected with  [name]  ([id number], 1981-07-05) on 12/20/23 at  4:15 PM EDT by a video-enabled telemedicine application and verified that I am speaking with the correct person using two identifiers.  Location: Patient: Virtual Visit Location Patient:  Home Provider: Virtual Visit Location Provider: Home Office   I discussed the limitations of evaluation and management by telemedicine and the availability of in person appointments. The patient expressed understanding and agreed to proceed.    History of Present Illness: Nicole Pollard is a 43 y.o. who identifies as a female who was assigned female at birth, and is being seen today for vaginal discomfort.   She had STI testing 2 days ago and everything was negative.  Since that time she has felt like she had a yeast infection.   She feels that this is a recurring issue for her.  She has tried OTC in the past and that causes her discomfort   She has had good outcomes with Diflucan  in the past    Problems:  Patient Active Problem List   Diagnosis Date Noted   Left hip pain 06/27/2023   Allergic sinusitis 06/27/2023   Tick bite of lower back 10/19/2022   Encounter for general adult medical examination with abnormal findings 06/28/2022   Obesity (BMI 30-39.9) 06/28/2022   Umbilical hernia without obstruction and without gangrene 02/10/2022   Acne vulgaris 11/11/2021   OSA (obstructive sleep apnea) 10/01/2021   Obsessive-compulsive disorder 07/21/2021   Mixed hyperlipidemia 05/31/2021   Hypothyroidism 03/31/2021   Cervical pain (neck) 03/29/2021   Vitamin D  deficiency 03/29/2021   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 (Patient not taking: Reported on 10/25/2023), Disp: , Rfl:    fluticasone  (FLONASE ) 50 MCG/ACT nasal spray, Place 2 sprays into both nostrils daily., Disp: 16 g, Rfl: 1   levothyroxine  (SYNTHROID ) 25 MCG  tablet, TAKE 1 TABLET BY MOUTH ONCE DAILY WITH BREAKFAST. (Patient not taking: Reported on 10/25/2023), Disp: 30 tablet, Rfl: 3   Magnesium 400 MG CAPS, Take 400 mg by mouth daily. Taking for PVCs (Patient not taking: Reported on 10/25/2023), Disp: , Rfl:    tretinoin  (RETIN-A ) 0.025 % cream, Apply topically at bedtime., Disp: 45 g, Rfl: 0  Observations/Objective: Patient is well-developed, well-nourished in no acute distress.  Resting comfortably  at home.  Head is normocephalic, atraumatic.  No labored breathing.  Speech is clear and coherent with logical content.  Patient is alert and oriented at baseline.    Assessment and Plan:  1. Yeast vaginitis  Meds ordered this encounter  Medications   fluconazole  (DIFLUCAN ) 150 MG tablet    Sig: Take 1 tablet (150 mg total) by mouth once for 1 dose.    Dispense:  1 tablet    Refill:  0     Follow Up Instructions: I discussed the assessment and treatment plan with the patient. The patient was provided an opportunity to ask questions and all were answered. The patient agreed with the plan and demonstrated an understanding of the instructions.  A copy of instructions were sent to the patient via MyChart unless otherwise noted below.    The patient was advised to call back or seek an in-person evaluation if the symptoms worsen or if the condition fails to improve as anticipated.    [name], FNP

## 2024-01-19 ENCOUNTER — Telehealth: Admitting: Physician Assistant

## 2024-01-19 ENCOUNTER — Encounter: Payer: Self-pay | Admitting: Internal Medicine

## 2024-01-19 DIAGNOSIS — B3731 Acute candidiasis of vulva and vagina: Secondary | ICD-10-CM | POA: Diagnosis not present

## 2024-01-19 MED ORDER — FLUCONAZOLE 150 MG PO TABS: 150.0000 mg | ORAL_TABLET | ORAL | 0 refills | Status: DC | PRN

## 2024-01-19 NOTE — Progress Notes (Signed)

## 2024-01-22 NOTE — Telephone Encounter (Signed)
 LVM to call and schedule appt  Offered 6/25

## 2024-01-24 ENCOUNTER — Encounter: Payer: Self-pay | Admitting: Internal Medicine

## 2024-01-24 ENCOUNTER — Telehealth (INDEPENDENT_AMBULATORY_CARE_PROVIDER_SITE_OTHER): Admitting: Internal Medicine

## 2024-01-24 DIAGNOSIS — E039 Hypothyroidism, unspecified: Secondary | ICD-10-CM | POA: Diagnosis not present

## 2024-01-24 DIAGNOSIS — F429 Obsessive-compulsive disorder, unspecified: Secondary | ICD-10-CM

## 2024-01-24 DIAGNOSIS — F4310 Post-traumatic stress disorder, unspecified: Secondary | ICD-10-CM

## 2024-01-24 DIAGNOSIS — F411 Generalized anxiety disorder: Secondary | ICD-10-CM | POA: Diagnosis not present

## 2024-01-24 MED ORDER — BUSPIRONE HCL 7.5 MG PO TABS: 7.5000 mg | ORAL_TABLET | Freq: Two times a day (BID) | ORAL | 1 refills | Status: DC

## 2024-01-24 MED ORDER — HYDROXYZINE PAMOATE 25 MG PO CAPS: 25.0000 mg | ORAL_CAPSULE | Freq: Three times a day (TID) | ORAL | 1 refills | Status: DC | PRN

## 2024-01-24 NOTE — Assessment & Plan Note (Signed)
 Had recent worsening of anxiety, due to personal relationship issues Counseling done today Advised to start Prozac  again as it had worked well in the past for her PTSD and OCD as well, but she prefers to avoid it Started buspirone 7.5 mg BID Vistaril as needed for severe anxiety

## 2024-01-24 NOTE — Assessment & Plan Note (Signed)
 History of PTSD due to previous relationships Had responded well to Prozac , but prefers to avoid it as she felt emotionless Started buspirone for anxiety

## 2024-01-24 NOTE — Assessment & Plan Note (Signed)
 Lab Results  Component Value Date   TSH 1.970 06/27/2023   On Levothyroxine  25 mcg QD,  has started taking it again, advised to remain compliant Had weight gain and hair loss before starting Levothyroxine  Check TSH and free T4

## 2024-01-24 NOTE — Assessment & Plan Note (Signed)
 Intrusive thoughts and acts history since childhood Periods of repetitive activities Has episodes of paranoia at times Was better with Prozac  20 mg daily now, but denies to take it now  Her recent major decisions of life - adapting a new religion and leaving job are concerning, advised to contact if any change in condition or safety concern

## 2024-01-24 NOTE — Progress Notes (Signed)
 Virtual Visit via Video Note   Because of [name]'s co-morbid illnesses, she is at least at moderate risk for complications without adequate follow up.  This format is felt to be most appropriate for this patient at this time.  All issues noted in this document were discussed and addressed.  A limited physical exam was performed with this format.      Evaluation Performed:  Follow-up visit  Date:  01/24/2024   ID:  Nicole, Pollard 06-14-1981, MRN [id number]  Patient Location: Home Provider Location: Office/Clinic  Participants: Patient Location of Patient: Home Location of Provider: Telehealth Consent was obtain for visit to be over via telehealth. I verified that I am speaking with the correct person using two identifiers.  PCP:  Nicole Suzzane POUR, MD   Chief Complaint: Anxiety  History of Present Illness:    Nicole Pollard is a 43 y.o. female with PMH of hypothyroidism, OCD, PTSD and diastasis recti s/p surgery who has a video visit for c/o worsening anxiety.  She reports worsening of anxiety for the last 2 weeks.  She has had panic episodes, where she has severe anxiety, shaking and palpitations.  She also has crying spells and easy irritability.  She reports being triggered by some people, which leads to severe anxiety.  She recently came out of a relationship, and thinks that she is not being treated fairly by other people that she tries to date.  She has a history of PTSD and OCD as well.  She was doing better with Prozac  in the past, but stopped taking it later as it was making her emotionless.  She prefers to avoid taking it for now.  The patient does not have symptoms concerning for COVID-19 infection (fever, chills, cough, or new shortness of breath).   Past Medical, Surgical, Social History, Allergies, and Medications have been Reviewed.  Past Medical History:  Diagnosis Date   Abnormal Pap smear of cervix    12/15/14 - ASCUS + HPV   Anxiety 01/28/2015    Overview:  On zoloft 50mg   EPDS 7 on 03/18/15 Mood stable  Last Assessment & Plan:  Pt states that her mood is stable on zoloft 50mg    Anxiety 01/28/2015   Overview:  On zoloft 50mg   EPDS 7 on 03/18/15 Mood stable  Last Assessment & Plan:  Pt states that her mood is stable on zoloft 50mg   Formatting of this note might be different from the original. On zoloft 50mg   EPDS 7 on 03/18/15 Mood stable  Overview:  Overview:  On zoloft 50mg   EPDS 7 on 03/18/15 Mood stable  Last Assessment & Plan:  Pt states that her mood is stable on zoloft 50mg   Last Assessmen   Cellulitis of abdominal wall 09/01/2016   Hypothyroid    Medical history non-contributory    Short cervix affecting pregnancy 05/23/2015   Cerclage [facility] 04/03/15 at [facility]    Vaginal Pap smear, abnormal    06/23/14 ASCUS + HPV   Past Surgical History:  Procedure Laterality Date   ABDOMINOPLASTY  06/28/2016   [place], [place]   APPENDECTOMY     CESAREAN SECTION     TUBAL LIGATION       Current Meds  Medication Sig   busPIRone (BUSPAR) 7.5 MG tablet Take 1 tablet (7.5 mg total) by mouth 2 (two) times daily.   hydrOXYzine (VISTARIL) 25 MG capsule Take 1 capsule (25 mg total) by mouth every 8 (eight) hours as needed.  Allergies:   Patient has no known allergies.   ROS:   Please see the history of present illness. All other systems reviewed and are negative.   Labs/Other Tests and Data Reviewed:    Recent Labs: 06/27/2023: ALT 16; BUN 7; Creatinine, Ser 0.64; Hemoglobin 12.0; Platelets 463; Potassium 4.5; Sodium 139; TSH 1.970   Recent Lipid Panel Lab Results  Component Value Date/Time   CHOL 202 (H) 06/27/2023 03:29 PM   TRIG 85 06/27/2023 03:29 PM   HDL 71 06/27/2023 03:29 PM   CHOLHDL 2.8 06/27/2023 03:29 PM   LDLCALC 116 (H) 06/27/2023 03:29 PM    Wt Readings from Last 3 Encounters:  10/25/23 151 lb 9.6 oz (68.8 kg)  06/27/23 168 lb (76.2 kg)  02/22/23 170 lb 9.6 oz (77.4 kg)     Objective:    Vital Signs:   There were no vitals taken for this visit.   VITAL SIGNS:  reviewed GEN:  no acute distress EYES:  sclerae anicteric, EOMI - Extraocular Movements Intact RESPIRATORY:  normal respiratory effort, symmetric expansion NEURO:  alert and oriented x 3, no obvious focal deficit PSYCH:  Anxious.  Crying affect.  ASSESSMENT & PLAN:    GAD (generalized anxiety disorder) Had recent worsening of anxiety, due to personal relationship issues Counseling done today Advised to start Prozac  again as it had worked well in the past for her PTSD and OCD as well, but she prefers to avoid it Started buspirone 7.5 mg BID Vistaril as needed for severe anxiety  Posttraumatic stress disorder History of PTSD due to previous relationships Had responded well to Prozac , but prefers to avoid it as she felt emotionless Started buspirone for anxiety  Hypothyroidism Lab Results  Component Value Date   TSH 1.970 06/27/2023   On Levothyroxine  25 mcg QD,  has started taking it again, advised to remain compliant Had weight gain and hair loss before starting Levothyroxine  Check TSH and free T4  Obsessive-compulsive disorder Intrusive thoughts and acts history since childhood Periods of repetitive activities Has episodes of paranoia at times Was better with Prozac  20 mg daily now, but denies to take it now  Her recent major decisions of life - adapting a new religion and leaving job are concerning, advised to contact if any change in condition or safety concern    I discussed the assessment and treatment plan with the patient. The patient was provided an opportunity to ask questions, and all were answered. The patient agreed with the plan and demonstrated an understanding of the instructions.   The patient was advised to call back or seek an in-person evaluation if the symptoms worsen or if the condition fails to improve as anticipated.  The above assessment and management plan was discussed with the patient.  The patient verbalized understanding of and has agreed to the management plan.   Medication Adjustments/Labs and Tests Ordered: Current medicines are reviewed at length with the patient today.  Concerns regarding medicines are outlined above.   Tests Ordered: No orders of the defined types were placed in this encounter.   Medication Changes: Meds ordered this encounter  Medications   hydrOXYzine (VISTARIL) 25 MG capsule    Sig: Take 1 capsule (25 mg total) by mouth every 8 (eight) hours as needed.    Dispense:  30 capsule    Refill:  1   busPIRone (BUSPAR) 7.5 MG tablet    Sig: Take 1 tablet (7.5 mg total) by mouth 2 (two) times daily.  Dispense:  60 tablet    Refill:  1     Note: This dictation was prepared with Dragon dictation along with smaller phrase technology. Similar sounding words can be transcribed inadequately or may not be corrected upon review. Any transcriptional errors that result from this process are unintentional.      Disposition:  Follow up  Signed, Suzzane MARLA Blanch, MD  01/24/2024 2:47 PM     [facility]

## 2024-01-24 NOTE — Patient Instructions (Signed)
 Please start taking buspirone as prescribed.  Please take Vistaril as needed for severe anxiety or panic episode.  Please perform simple relaxation techniques as shown in material.

## 2024-03-05 ENCOUNTER — Ambulatory Visit: Admitting: Internal Medicine

## 2024-03-05 VITALS — BP 117/79 | HR 75 | Ht 63.0 in | Wt 153.0 lb

## 2024-03-05 DIAGNOSIS — F4310 Post-traumatic stress disorder, unspecified: Secondary | ICD-10-CM

## 2024-03-05 DIAGNOSIS — B3731 Acute candidiasis of vulva and vagina: Secondary | ICD-10-CM | POA: Insufficient documentation

## 2024-03-05 DIAGNOSIS — E782 Mixed hyperlipidemia: Secondary | ICD-10-CM

## 2024-03-05 DIAGNOSIS — E039 Hypothyroidism, unspecified: Secondary | ICD-10-CM | POA: Diagnosis not present

## 2024-03-05 DIAGNOSIS — E559 Vitamin D deficiency, unspecified: Secondary | ICD-10-CM

## 2024-03-05 DIAGNOSIS — F429 Obsessive-compulsive disorder, unspecified: Secondary | ICD-10-CM | POA: Diagnosis not present

## 2024-03-05 MED ORDER — LEVOTHYROXINE SODIUM 25 MCG PO TABS: 25.0000 ug | ORAL_TABLET | Freq: Every day | ORAL | 5 refills | Status: AC

## 2024-03-05 NOTE — Assessment & Plan Note (Signed)
 Last vitamin D  Lab Results  Component Value Date   VD25OH 86.4 06/27/2023   Advised to take Vitamin D  2000 IU QD

## 2024-03-05 NOTE — Patient Instructions (Signed)
 Please start taking Levothyroxine  regularly.  Please take Hydroxyzine  as needed for insomnia.  Please continue to follow low carb diet and perform moderate exercise/walking at least 150 mins/week.

## 2024-03-05 NOTE — Assessment & Plan Note (Signed)
 History of PTSD due to previous relationships Had responded well to Prozac , but prefers to avoid it as she felt emotionless Started buspirone  for anxiety, but did not take it Prefers to avoid [facility] therapy

## 2024-03-05 NOTE — Progress Notes (Signed)
 Established Patient Office Visit  Subjective:  Patient ID: Nicole Pollard, female    DOB: 12-Mar-1981  Age: 43 y.o. MRN: [id number]  CC:  Chief Complaint  Patient presents with   Hypothyroidism    Four month follow up   Post-Traumatic Stress Disorder    Four month follow up    HPI Nicole Pollard is a 43 y.o. female with past medical history of hypothyroidism, OCD, PTSD and diastasis recti s/p surgery who presents for f/u of her chronic medical conditions.  Obesity: She had compounded semaglutide through a medical spa for 3 months and has been able to maintain her weight since stopping it, had 16 lbs weight loss with it. She has been trying to follow strict low-carb diet and has been exercising regularly. She was having difficulty losing weight, but had started losing weight following vegetarian diet in the past. Has tried phentermine , but it was not helping with weight loss.  OCD and PTSD: She had been feeling better with Prozac  now,  but has stopped now as she felt it was making her emotionless. She has adapted Muslim religion. 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 She had worsening of anxiety initially after stopping Prozac , but still prefers to avoid it. She was in a relationship recently which was provoking her anxiety. She currently denies any SI or HI.  She was able to focus better with Prozac  before, but does not want to take medicine for now.  Hypothyroidism: She has been taking levothyroxine  25 mcg QD inconsistently.  Denies any recent change in appetite, or leg swelling. She has started having hair loss and fatigue again.  Past Medical History:  Diagnosis Date   Abnormal Pap smear of cervix    12/15/14 - ASCUS + HPV   Anxiety 01/28/2015   Overview:  On zoloft 50mg   EPDS 7 on 03/18/15 Mood stable  Last Assessment & Plan:  Pt states that her mood is stable on zoloft 50mg    Anxiety 01/28/2015   Overview:  On zoloft 50mg   EPDS 7 on 03/18/15 Mood  stable  Last Assessment & Plan:  Pt states that her mood is stable on zoloft 50mg   Formatting of this note might be different from the original. On zoloft 50mg   EPDS 7 on 03/18/15 Mood stable  Overview:  Overview:  On zoloft 50mg   EPDS 7 on 03/18/15 Mood stable  Last Assessment & Plan:  Pt states that her mood is stable on zoloft 50mg   Last Assessmen   Cellulitis of abdominal wall 09/01/2016   Hypothyroid    Medical history non-contributory    Short cervix affecting pregnancy 05/23/2015   Cerclage placed 04/03/15 at [facility]    Vaginal Pap smear, abnormal    06/23/14 ASCUS + HPV    Past Surgical History:  Procedure Laterality Date   ABDOMINOPLASTY  06/28/2016   [place], [place]   APPENDECTOMY     CESAREAN SECTION     TUBAL LIGATION      Family History  Problem Relation Age of Onset   Alcohol abuse Mother    Drug abuse Father    Hypertension Father    Anxiety disorder Maternal Aunt    Cancer Maternal Grandmother        Cervical    Social History   Socioeconomic History   Marital status: Divorced    Spouse name: Not on file   Number of children: Not on file   Years of education:  Not on file   Highest education level: Associate degree: academic program  Occupational History   Not on file  Tobacco Use   Smoking status: Never   Smokeless tobacco: Never  Substance and Sexual Activity   Alcohol use: Yes    Comment: Social   Drug use: No   Sexual activity: Yes    Birth control/protection: Surgical  Other Topics Concern   Not on file  Social History Narrative   Not on file   Social Drivers of Health   Financial Resource Strain: Medium Risk (03/05/2024)   Overall Financial Resource Strain (CARDIA)    Difficulty of Paying Living Expenses: Somewhat hard  Food Insecurity: No Food Insecurity (03/05/2024)   Hunger Vital Sign    Worried About Running Out of Food in the Last Year: Never true    Ran Out of Food in the Last Year: Never true  Transportation Needs: No Transportation  Needs (03/05/2024)   PRAPARE - Administrator, Civil Service (Medical): No    Lack of Transportation (Non-Medical): No  Physical Activity: Insufficiently Active (03/05/2024)   Exercise Vital Sign    Days of Exercise per Week: 3 days    Minutes of Exercise per Session: 10 min  Stress: Stress Concern Present (03/05/2024)   [employer] of Occupational Health - Occupational Stress Questionnaire    Feeling of Stress: Rather much  Social Connections: Moderately Integrated (03/05/2024)   Social Connection and Isolation Panel    Frequency of Communication with Friends and Family: Twice a week    Frequency of Social Gatherings with Friends and Family: Once a week    Attends Religious Services: More than 4 times per year    Active Member of [employer] or Organizations: Yes    Attends Engineer, structural: More than 4 times per year    Marital Status: Divorced  Intimate Partner Violence: Not At Risk (04/01/2021)   Humiliation, Afraid, Rape, and Kick questionnaire    Fear of Current or Ex-Partner: No    Emotionally Abused: No    Physically Abused: No    Sexually Abused: No    Outpatient Medications Prior to Visit  Medication Sig Dispense Refill   cholecalciferol (VITAMIN D3) 25 MCG (1000 UNIT) tablet Take 1,000 Units by mouth daily. (Patient not taking: Reported on 10/25/2023)     fluticasone  (FLONASE ) 50 MCG/ACT nasal spray Place 2 sprays into both nostrils daily. 16 g 1   hydrOXYzine  (VISTARIL ) 25 MG capsule Take 1 capsule (25 mg total) by mouth every 8 (eight) hours as needed. 30 capsule 1   Magnesium 400 MG CAPS Take 400 mg by mouth daily. Taking for PVCs (Patient not taking: Reported on 10/25/2023)     tretinoin  (RETIN-A ) 0.025 % cream Apply topically at bedtime. 45 g 0   busPIRone  (BUSPAR ) 7.5 MG tablet Take 1 tablet (7.5 mg total) by mouth 2 (two) times daily. 60 tablet 1   fluconazole  (DIFLUCAN ) 150 MG tablet Take 1 tablet (150 mg total) by mouth every 3 (three) days as  needed. 2 tablet 0   levothyroxine  (SYNTHROID ) 25 MCG tablet TAKE 1 TABLET BY MOUTH ONCE DAILY WITH BREAKFAST. (Patient not taking: Reported on 10/25/2023) 30 tablet 3   No facility-administered medications prior to visit.    No Known Allergies  ROS Review of Systems  Constitutional:  Negative for chills and fever.  HENT:  Negative for congestion, postnasal drip and sinus pressure.   Eyes:  Negative for pain and discharge.  Respiratory:  Negative for cough and shortness of breath.   Cardiovascular:  Negative for chest pain and palpitations.  Gastrointestinal:  Negative for nausea and vomiting.  Genitourinary:  Negative for dysuria and hematuria.  Musculoskeletal:  Negative for neck pain and neck stiffness.  Skin:  Negative for rash.  Neurological:  Negative for dizziness and weakness.  Psychiatric/Behavioral:  Positive for dysphoric mood and sleep disturbance. Negative for agitation and behavioral problems. The patient is nervous/anxious.       Objective:    Physical Exam Vitals reviewed.  Constitutional:      General: She is not in acute distress.    Appearance: She is not diaphoretic.  HENT:     Head: Normocephalic and atraumatic.     Nose: Congestion present.     Mouth/Throat:     Mouth: Mucous membranes are moist.     Pharynx: No posterior oropharyngeal erythema.  Eyes:     General: No scleral icterus.    Extraocular Movements: Extraocular movements intact.  Cardiovascular:     Rate and Rhythm: Normal rate and regular rhythm.     Heart sounds: Normal heart sounds. No murmur heard. Pulmonary:     Breath sounds: Normal breath sounds. No wheezing or rales.  Musculoskeletal:     Cervical back: Neck supple. No tenderness.     Right lower leg: No edema.     Left lower leg: No edema.  Skin:    General: Skin is warm.     Findings: Lesion (Acneiform over face area) present. No rash.  Neurological:     General: No focal deficit present.     Mental Status: She is alert  and oriented to person, place, and time.     Sensory: No sensory deficit.     Motor: No weakness.  Psychiatric:        Mood and Affect: Mood is anxious. Affect is tearful.        Behavior: Behavior is cooperative.     BP 117/79   Pulse 75   Ht 5' 3 (1.6 m)   Wt 153 lb (69.4 kg)   BMI 27.10 kg/m  Wt Readings from Last 3 Encounters:  03/05/24 153 lb (69.4 kg)  10/25/23 151 lb 9.6 oz (68.8 kg)  06/27/23 168 lb (76.2 kg)    Lab Results  Component Value Date   TSH 1.970 06/27/2023   Lab Results  Component Value Date   WBC 7.5 06/27/2023   HGB 12.0 06/27/2023   HCT 36.5 06/27/2023   MCV 88 06/27/2023   PLT 463 (H) 06/27/2023   Lab Results  Component Value Date   NA 139 06/27/2023   K 4.5 06/27/2023   CO2 25 06/27/2023   GLUCOSE 79 06/27/2023   BUN 7 06/27/2023   CREATININE 0.64 06/27/2023   BILITOT 0.3 06/27/2023   ALKPHOS 93 06/27/2023   AST 16 06/27/2023   ALT 16 06/27/2023   PROT 6.4 06/27/2023   ALBUMIN 4.1 06/27/2023   CALCIUM 9.3 06/27/2023   ANIONGAP 9 07/25/2017   EGFR 114 06/27/2023   Lab Results  Component Value Date   CHOL 202 (H) 06/27/2023   Lab Results  Component Value Date   HDL 71 06/27/2023   Lab Results  Component Value Date   LDLCALC 116 (H) 06/27/2023   Lab Results  Component Value Date   TRIG 85 06/27/2023   Lab Results  Component Value Date   CHOLHDL 2.8 06/27/2023   Lab Results  Component Value Date   HGBA1C  5.3 06/28/2022      Assessment & Plan:   Problem List Items Addressed This Visit       Endocrine   Hypothyroidism   Lab Results  Component Value Date   TSH 1.970 06/27/2023   On Levothyroxine  25 mcg QD,  has started taking it again, advised to remain compliant Had weight gain and hair loss before starting Levothyroxine  Check TSH and free T4      Relevant Medications   levothyroxine  (SYNTHROID ) 25 MCG tablet   Other Relevant Orders   TSH + free T4     Genitourinary   Yeast vaginitis   He was  recently given fluconazole  by e-visit provider Still has persistent irritation in perineal/vaginal area - check vaginal swab      Relevant Orders   NuSwab Vaginitis (VG)     Other   Obsessive-compulsive disorder - Primary (Chronic)   Intrusive thoughts and acts history since childhood Periods of repetitive activities Has episodes of paranoia at times Was better with Prozac  20 mg daily now, but denies to take it now  Her recent major decisions of life - adapting a new religion and leaving job are concerning, advised to contact if any change in condition or safety concern      Relevant Orders   CMP14+EGFR   CBC with Differential/Platelet   Posttraumatic stress disorder   History of PTSD due to previous relationships Had responded well to Prozac , but prefers to avoid it as she felt emotionless Started buspirone  for anxiety, but did not take it Prefers to avoid [facility] therapy      Vitamin D  deficiency   Last vitamin D  Lab Results  Component Value Date   VD25OH 86.4 06/27/2023   Advised to take Vitamin D  2000 IU QD      Relevant Orders   VITAMIN D  25 Hydroxy (Vit-D Deficiency, Fractures)   Mixed hyperlipidemia   Check lipid profile Advised to continue plant-based, low cholesterol diet      Relevant Orders   Lipid panel     Meds ordered this encounter  Medications   levothyroxine  (SYNTHROID ) 25 MCG tablet    Sig: Take 1 tablet (25 mcg total) by mouth daily before breakfast.    Dispense:  30 tablet    Refill:  5    Follow-up: Return in about 4 months (around 07/05/2024) for Annual physical.    Suzzane MARLA Blanch, MD

## 2024-03-05 NOTE — Assessment & Plan Note (Signed)
 Intrusive thoughts and acts history since childhood Periods of repetitive activities Has episodes of paranoia at times Was better with Prozac  20 mg daily now, but denies to take it now  Her recent major decisions of life - adapting a new religion and leaving job are concerning, advised to contact if any change in condition or safety concern

## 2024-03-05 NOTE — Assessment & Plan Note (Signed)
 Check lipid profile Advised to continue plant-based, low cholesterol diet

## 2024-03-05 NOTE — Assessment & Plan Note (Signed)
 He was recently given fluconazole  by e-visit provider Still has persistent irritation in perineal/vaginal area - check vaginal swab

## 2024-03-05 NOTE — Assessment & Plan Note (Signed)
 Lab Results  Component Value Date   TSH 1.970 06/27/2023   On Levothyroxine  25 mcg QD,  has started taking it again, advised to remain compliant Had weight gain and hair loss before starting Levothyroxine  Check TSH and free T4

## 2024-03-08 ENCOUNTER — Ambulatory Visit: Payer: Self-pay | Admitting: Internal Medicine

## 2024-03-08 LAB — NUSWAB VAGINITIS (VG)
Candida albicans, NAA: NEGATIVE
Candida glabrata, NAA: NEGATIVE
Trich vag by NAA: NEGATIVE

## 2024-04-26 ENCOUNTER — Other Ambulatory Visit: Payer: Self-pay | Admitting: Internal Medicine

## 2024-04-26 DIAGNOSIS — F411 Generalized anxiety disorder: Secondary | ICD-10-CM

## 2024-05-23 ENCOUNTER — Telehealth

## 2024-07-09 ENCOUNTER — Encounter: Admitting: Internal Medicine
